# Patient Record
Sex: Female | Born: 1970
Health system: Southern US, Community
[De-identification: ages and names within clinical notes are randomized; demographics above are authoritative.]

## PROBLEM LIST (undated history)

## (undated) DIAGNOSIS — I10 Essential (primary) hypertension: Secondary | ICD-10-CM

## (undated) DIAGNOSIS — S59909A Unspecified injury of unspecified elbow, initial encounter: Secondary | ICD-10-CM

## (undated) DIAGNOSIS — C801 Malignant (primary) neoplasm, unspecified: Secondary | ICD-10-CM

## (undated) DIAGNOSIS — R51 Headache: Secondary | ICD-10-CM

## (undated) DIAGNOSIS — IMO0001 Reserved for inherently not codable concepts without codable children: Secondary | ICD-10-CM

## (undated) DIAGNOSIS — K219 Gastro-esophageal reflux disease without esophagitis: Secondary | ICD-10-CM

## (undated) DIAGNOSIS — Z5189 Encounter for other specified aftercare: Secondary | ICD-10-CM

## (undated) HISTORY — DX: Essential (primary) hypertension: I10

## (undated) HISTORY — PX: OTHER SURGICAL HISTORY: SHX169

---

## 2002-06-07 HISTORY — PX: ADRENALECTOMY: SHX876

## 2002-06-07 HISTORY — PX: BRAIN SURGERY: SHX531

## 2004-06-11 ENCOUNTER — Inpatient Hospital Stay (HOSPITAL_COMMUNITY): Admission: EM | Admit: 2004-06-11 | Discharge: 2004-06-12 | Payer: Self-pay | Admitting: Emergency Medicine

## 2004-06-11 ENCOUNTER — Ambulatory Visit: Payer: Self-pay | Admitting: Oncology

## 2004-06-12 ENCOUNTER — Ambulatory Visit: Admission: RE | Admit: 2004-06-12 | Discharge: 2004-07-31 | Payer: Self-pay | Admitting: Radiation Oncology

## 2004-06-16 ENCOUNTER — Ambulatory Visit: Payer: Self-pay | Admitting: Oncology

## 2004-08-07 ENCOUNTER — Ambulatory Visit: Payer: Self-pay | Admitting: Oncology

## 2004-08-24 ENCOUNTER — Ambulatory Visit (HOSPITAL_COMMUNITY): Admission: RE | Admit: 2004-08-24 | Discharge: 2004-08-24 | Payer: Self-pay | Admitting: Oncology

## 2004-09-02 ENCOUNTER — Ambulatory Visit: Admission: RE | Admit: 2004-09-02 | Discharge: 2004-09-02 | Payer: Self-pay | Admitting: Radiation Oncology

## 2004-09-11 ENCOUNTER — Ambulatory Visit (HOSPITAL_COMMUNITY): Admission: RE | Admit: 2004-09-11 | Discharge: 2004-09-11 | Payer: Self-pay | Admitting: Oncology

## 2004-09-23 ENCOUNTER — Ambulatory Visit: Payer: Self-pay | Admitting: Oncology

## 2004-10-26 ENCOUNTER — Ambulatory Visit (HOSPITAL_COMMUNITY): Admission: RE | Admit: 2004-10-26 | Discharge: 2004-10-26 | Payer: Self-pay | Admitting: Oncology

## 2004-11-19 ENCOUNTER — Ambulatory Visit: Payer: Self-pay | Admitting: Oncology

## 2004-11-28 ENCOUNTER — Emergency Department (HOSPITAL_COMMUNITY): Admission: EM | Admit: 2004-11-28 | Discharge: 2004-11-28 | Payer: Self-pay | Admitting: Emergency Medicine

## 2004-12-04 ENCOUNTER — Encounter: Admission: RE | Admit: 2004-12-04 | Discharge: 2004-12-04 | Payer: Self-pay | Admitting: Family Medicine

## 2005-01-07 ENCOUNTER — Ambulatory Visit: Payer: Self-pay | Admitting: Oncology

## 2005-02-16 ENCOUNTER — Ambulatory Visit: Payer: Self-pay | Admitting: Oncology

## 2005-03-23 ENCOUNTER — Ambulatory Visit (HOSPITAL_COMMUNITY): Admission: RE | Admit: 2005-03-23 | Discharge: 2005-03-23 | Payer: Self-pay | Admitting: Oncology

## 2005-05-07 ENCOUNTER — Ambulatory Visit (HOSPITAL_COMMUNITY): Admission: RE | Admit: 2005-05-07 | Discharge: 2005-05-07 | Payer: Self-pay | Admitting: Oncology

## 2005-05-12 ENCOUNTER — Ambulatory Visit: Payer: Self-pay | Admitting: Oncology

## 2005-05-27 ENCOUNTER — Ambulatory Visit (HOSPITAL_COMMUNITY): Admission: RE | Admit: 2005-05-27 | Discharge: 2005-05-27 | Payer: Self-pay | Admitting: Oncology

## 2005-06-11 ENCOUNTER — Ambulatory Visit (HOSPITAL_COMMUNITY): Admission: RE | Admit: 2005-06-11 | Discharge: 2005-06-11 | Payer: Self-pay | Admitting: Oncology

## 2005-07-21 ENCOUNTER — Ambulatory Visit: Payer: Self-pay | Admitting: Oncology

## 2005-09-16 ENCOUNTER — Ambulatory Visit: Payer: Self-pay | Admitting: Oncology

## 2005-10-26 ENCOUNTER — Ambulatory Visit (HOSPITAL_COMMUNITY): Admission: RE | Admit: 2005-10-26 | Discharge: 2005-10-26 | Payer: Self-pay | Admitting: Obstetrics & Gynecology

## 2005-11-22 ENCOUNTER — Ambulatory Visit: Payer: Self-pay | Admitting: Oncology

## 2005-12-16 ENCOUNTER — Ambulatory Visit (HOSPITAL_COMMUNITY): Admission: RE | Admit: 2005-12-16 | Discharge: 2005-12-16 | Payer: Self-pay | Admitting: Obstetrics & Gynecology

## 2006-02-01 ENCOUNTER — Ambulatory Visit (HOSPITAL_COMMUNITY): Admission: RE | Admit: 2006-02-01 | Discharge: 2006-02-01 | Payer: Self-pay | Admitting: Obstetrics & Gynecology

## 2006-02-15 ENCOUNTER — Ambulatory Visit: Payer: Self-pay | Admitting: Oncology

## 2006-03-24 ENCOUNTER — Encounter (INDEPENDENT_AMBULATORY_CARE_PROVIDER_SITE_OTHER): Payer: Self-pay | Admitting: *Deleted

## 2006-03-24 ENCOUNTER — Inpatient Hospital Stay (HOSPITAL_COMMUNITY): Admission: AD | Admit: 2006-03-24 | Discharge: 2006-03-27 | Payer: Self-pay | Admitting: Obstetrics & Gynecology

## 2006-03-24 HISTORY — PX: TUBAL LIGATION: SHX77

## 2006-03-31 ENCOUNTER — Ambulatory Visit: Payer: Self-pay | Admitting: Oncology

## 2006-05-12 ENCOUNTER — Ambulatory Visit: Payer: Self-pay | Admitting: Oncology

## 2006-06-02 ENCOUNTER — Encounter: Admission: RE | Admit: 2006-06-02 | Discharge: 2006-06-02 | Payer: Self-pay | Admitting: Internal Medicine

## 2006-06-27 ENCOUNTER — Ambulatory Visit: Payer: Self-pay | Admitting: Oncology

## 2006-08-16 ENCOUNTER — Ambulatory Visit: Payer: Self-pay | Admitting: Oncology

## 2006-08-16 LAB — CBC WITH DIFFERENTIAL/PLATELET
Basophils Absolute: 0 10*3/uL (ref 0.0–0.1)
EOS%: 11.4 % — ABNORMAL HIGH (ref 0.0–7.0)
HCT: 30 % — ABNORMAL LOW (ref 34.8–46.6)
MCH: 30.2 pg (ref 26.0–34.0)
MCHC: 34.6 g/dL (ref 32.0–36.0)
MCV: 87.3 fL (ref 81.0–101.0)
MONO#: 0.1 10*3/uL (ref 0.1–0.9)
Platelets: 64 10*3/uL — ABNORMAL LOW (ref 145–400)
RBC: 3.43 10*6/uL — ABNORMAL LOW (ref 3.70–5.32)
lymph#: 0.9 10*3/uL (ref 0.9–3.3)

## 2006-08-25 LAB — CBC WITH DIFFERENTIAL/PLATELET
BASO%: 0.3 % (ref 0.0–2.0)
Basophils Absolute: 0 10*3/uL (ref 0.0–0.1)
EOS%: 4.6 % (ref 0.0–7.0)
HCT: 26.2 % — ABNORMAL LOW (ref 34.8–46.6)
HGB: 9.3 g/dL — ABNORMAL LOW (ref 11.6–15.9)
MCH: 30.6 pg (ref 26.0–34.0)
MCHC: 35.4 g/dL (ref 32.0–36.0)
MCV: 86.5 fL (ref 81.0–101.0)
MONO%: 6.2 % (ref 0.0–13.0)
NEUT%: 21 % — ABNORMAL LOW (ref 39.6–76.8)
lymph#: 0.9 10*3/uL (ref 0.9–3.3)

## 2006-08-29 LAB — CBC WITH DIFFERENTIAL/PLATELET
BASO%: 0.4 % (ref 0.0–2.0)
Basophils Absolute: 0 10*3/uL (ref 0.0–0.1)
EOS%: 3.5 % (ref 0.0–7.0)
MCH: 30.5 pg (ref 26.0–34.0)
MCHC: 35.3 g/dL (ref 32.0–36.0)
MCV: 86.6 fL (ref 81.0–101.0)
MONO%: 8.6 % (ref 0.0–13.0)
RBC: 3.01 10*6/uL — ABNORMAL LOW (ref 3.70–5.32)
RDW: 16.2 % — ABNORMAL HIGH (ref 11.3–14.5)
lymph#: 0.8 10*3/uL — ABNORMAL LOW (ref 0.9–3.3)

## 2006-08-29 LAB — HOLD TUBE, BLOOD BANK

## 2006-09-05 LAB — CBC WITH DIFFERENTIAL/PLATELET
BASO%: 0.2 % (ref 0.0–2.0)
LYMPH%: 63.5 % — ABNORMAL HIGH (ref 14.0–48.0)
MCHC: 35.6 g/dL (ref 32.0–36.0)
MONO#: 0.1 10*3/uL (ref 0.1–0.9)
NEUT#: 0.4 10*3/uL — CL (ref 1.5–6.5)
Platelets: 93 10*3/uL — ABNORMAL LOW (ref 145–400)
RBC: 2.73 10*6/uL — ABNORMAL LOW (ref 3.70–5.32)
RDW: 16.1 % — ABNORMAL HIGH (ref 11.3–14.5)
WBC: 1.6 10*3/uL — ABNORMAL LOW (ref 3.9–10.0)

## 2006-09-08 LAB — CBC WITH DIFFERENTIAL/PLATELET
BASO%: 0.2 % (ref 0.0–2.0)
Eosinophils Absolute: 0 10*3/uL (ref 0.0–0.5)
HCT: 22.2 % — ABNORMAL LOW (ref 34.8–46.6)
MCHC: 35 g/dL (ref 32.0–36.0)
MONO#: 0.2 10*3/uL (ref 0.1–0.9)
NEUT#: 0.4 10*3/uL — CL (ref 1.5–6.5)
NEUT%: 27.9 % — ABNORMAL LOW (ref 39.6–76.8)
RBC: 2.53 10*6/uL — ABNORMAL LOW (ref 3.70–5.32)
WBC: 1.6 10*3/uL — ABNORMAL LOW (ref 3.9–10.0)
lymph#: 0.9 10*3/uL (ref 0.9–3.3)

## 2006-09-08 LAB — HOLD TUBE, BLOOD BANK

## 2006-09-13 LAB — CBC WITH DIFFERENTIAL/PLATELET
BASO%: 0.4 % (ref 0.0–2.0)
Basophils Absolute: 0 10*3/uL (ref 0.0–0.1)
EOS%: 1.8 % (ref 0.0–7.0)
HCT: 21.8 % — ABNORMAL LOW (ref 34.8–46.6)
HGB: 7.8 g/dL — ABNORMAL LOW (ref 11.6–15.9)
LYMPH%: 52.2 % — ABNORMAL HIGH (ref 14.0–48.0)
MCH: 31.4 pg (ref 26.0–34.0)
MCHC: 35.7 g/dL (ref 32.0–36.0)
MCV: 87.9 fL (ref 81.0–101.0)
NEUT%: 34.7 % — ABNORMAL LOW (ref 39.6–76.8)
Platelets: 140 10*3/uL — ABNORMAL LOW (ref 145–400)

## 2006-09-14 ENCOUNTER — Ambulatory Visit (HOSPITAL_COMMUNITY): Admission: RE | Admit: 2006-09-14 | Discharge: 2006-09-14 | Payer: Self-pay | Admitting: Obstetrics & Gynecology

## 2006-09-19 LAB — CBC WITH DIFFERENTIAL/PLATELET
Basophils Absolute: 0 10*3/uL (ref 0.0–0.1)
EOS%: 2.8 % (ref 0.0–7.0)
Eosinophils Absolute: 0.1 10*3/uL (ref 0.0–0.5)
LYMPH%: 47.4 % (ref 14.0–48.0)
MCH: 30.9 pg (ref 26.0–34.0)
MCV: 88.9 fL (ref 81.0–101.0)
MONO%: 10.1 % (ref 0.0–13.0)
NEUT#: 0.8 10*3/uL — ABNORMAL LOW (ref 1.5–6.5)
Platelets: 205 10*3/uL (ref 145–400)
RBC: 2.66 10*6/uL — ABNORMAL LOW (ref 3.70–5.32)
RDW: 19.7 % — ABNORMAL HIGH (ref 11.3–14.5)

## 2006-09-26 LAB — CBC WITH DIFFERENTIAL/PLATELET
BASO%: 0.7 % (ref 0.0–2.0)
Eosinophils Absolute: 0.1 10*3/uL (ref 0.0–0.5)
LYMPH%: 37.8 % (ref 14.0–48.0)
MCHC: 34.4 g/dL (ref 32.0–36.0)
MCV: 92.3 fL (ref 81.0–101.0)
MONO%: 12.6 % (ref 0.0–13.0)
NEUT#: 1 10*3/uL — ABNORMAL LOW (ref 1.5–6.5)
Platelets: 212 10*3/uL (ref 145–400)
RBC: 2.58 10*6/uL — ABNORMAL LOW (ref 3.70–5.32)
RDW: 23.6 % — ABNORMAL HIGH (ref 11.3–14.5)
WBC: 2.2 10*3/uL — ABNORMAL LOW (ref 3.9–10.0)

## 2006-10-03 ENCOUNTER — Ambulatory Visit: Payer: Self-pay | Admitting: Oncology

## 2006-10-03 LAB — CBC WITH DIFFERENTIAL/PLATELET
BASO%: 0.3 % (ref 0.0–2.0)
Eosinophils Absolute: 0.1 10*3/uL (ref 0.0–0.5)
MCHC: 34.5 g/dL (ref 32.0–36.0)
MONO#: 0.3 10*3/uL (ref 0.1–0.9)
NEUT#: 1.7 10*3/uL (ref 1.5–6.5)
RBC: 2.75 10*6/uL — ABNORMAL LOW (ref 3.70–5.32)
RDW: 22.8 % — ABNORMAL HIGH (ref 11.3–14.5)
WBC: 3.1 10*3/uL — ABNORMAL LOW (ref 3.9–10.0)
lymph#: 0.9 10*3/uL (ref 0.9–3.3)

## 2006-12-02 ENCOUNTER — Ambulatory Visit: Payer: Self-pay | Admitting: Oncology

## 2006-12-06 LAB — CBC WITH DIFFERENTIAL/PLATELET
BASO%: 1.3 % (ref 0.0–2.0)
EOS%: 3.2 % (ref 0.0–7.0)
HGB: 9.9 g/dL — ABNORMAL LOW (ref 11.6–15.9)
MCH: 31.2 pg (ref 26.0–34.0)
MCHC: 33.6 g/dL (ref 32.0–36.0)
RBC: 3.18 10*6/uL — ABNORMAL LOW (ref 3.70–5.32)
RDW: 12.4 % (ref 11.3–14.5)
lymph#: 0.7 10*3/uL — ABNORMAL LOW (ref 0.9–3.3)

## 2006-12-25 ENCOUNTER — Emergency Department (HOSPITAL_COMMUNITY): Admission: EM | Admit: 2006-12-25 | Discharge: 2006-12-25 | Payer: Self-pay | Admitting: Family Medicine

## 2007-01-29 ENCOUNTER — Ambulatory Visit: Payer: Self-pay | Admitting: Oncology

## 2007-04-20 ENCOUNTER — Ambulatory Visit: Payer: Self-pay | Admitting: Oncology

## 2007-07-18 ENCOUNTER — Encounter: Admission: RE | Admit: 2007-07-18 | Discharge: 2007-07-18 | Payer: Self-pay | Admitting: Internal Medicine

## 2007-07-27 ENCOUNTER — Ambulatory Visit: Payer: Self-pay | Admitting: Oncology

## 2007-09-05 ENCOUNTER — Ambulatory Visit: Payer: Self-pay | Admitting: Oncology

## 2007-10-26 ENCOUNTER — Ambulatory Visit: Payer: Self-pay | Admitting: Oncology

## 2007-12-01 ENCOUNTER — Ambulatory Visit (HOSPITAL_COMMUNITY): Admission: RE | Admit: 2007-12-01 | Discharge: 2007-12-01 | Payer: Self-pay | Admitting: Oncology

## 2007-12-27 ENCOUNTER — Ambulatory Visit: Payer: Self-pay | Admitting: Oncology

## 2008-02-09 ENCOUNTER — Ambulatory Visit: Payer: Self-pay | Admitting: Oncology

## 2008-04-04 ENCOUNTER — Ambulatory Visit: Payer: Self-pay | Admitting: Oncology

## 2008-05-08 ENCOUNTER — Ambulatory Visit (HOSPITAL_COMMUNITY): Admission: RE | Admit: 2008-05-08 | Discharge: 2008-05-08 | Payer: Self-pay | Admitting: Oncology

## 2008-07-16 ENCOUNTER — Ambulatory Visit: Payer: Self-pay | Admitting: Oncology

## 2008-07-18 ENCOUNTER — Encounter: Admission: RE | Admit: 2008-07-18 | Discharge: 2008-07-18 | Payer: Self-pay | Admitting: Internal Medicine

## 2008-09-03 ENCOUNTER — Emergency Department (HOSPITAL_COMMUNITY): Admission: EM | Admit: 2008-09-03 | Discharge: 2008-09-03 | Payer: Self-pay | Admitting: Emergency Medicine

## 2009-02-18 ENCOUNTER — Ambulatory Visit: Payer: Self-pay | Admitting: Oncology

## 2009-03-25 ENCOUNTER — Ambulatory Visit: Payer: Self-pay | Admitting: Oncology

## 2009-07-24 ENCOUNTER — Encounter: Admission: RE | Admit: 2009-07-24 | Discharge: 2009-07-24 | Payer: Self-pay | Admitting: Internal Medicine

## 2009-08-22 ENCOUNTER — Ambulatory Visit: Payer: Self-pay | Admitting: Oncology

## 2009-10-17 ENCOUNTER — Ambulatory Visit: Payer: Self-pay | Admitting: Oncology

## 2009-12-19 ENCOUNTER — Ambulatory Visit: Payer: Self-pay | Admitting: Oncology

## 2010-03-17 ENCOUNTER — Ambulatory Visit: Payer: Self-pay | Admitting: Oncology

## 2010-04-23 ENCOUNTER — Ambulatory Visit: Payer: Self-pay | Admitting: Oncology

## 2010-05-06 LAB — CBC WITH DIFFERENTIAL/PLATELET
Basophils Absolute: 0 10*3/uL (ref 0.0–0.1)
EOS%: 2.5 % (ref 0.0–7.0)
Eosinophils Absolute: 0.1 10*3/uL (ref 0.0–0.5)
HGB: 11.3 g/dL — ABNORMAL LOW (ref 11.6–15.9)
LYMPH%: 46.1 % (ref 14.0–49.7)
MCHC: 34.1 g/dL (ref 31.5–36.0)
MONO#: 0.4 10*3/uL (ref 0.1–0.9)
MONO%: 8.7 % (ref 0.0–14.0)
NEUT%: 42.1 % (ref 38.4–76.8)
WBC: 4.6 10*3/uL (ref 3.9–10.3)

## 2010-05-12 LAB — COMPREHENSIVE METABOLIC PANEL
ALT: 13 U/L (ref 0–35)
AST: 18 U/L (ref 0–37)
BUN: 24 mg/dL — ABNORMAL HIGH (ref 6–23)
Chloride: 107 mEq/L (ref 96–112)
Creatinine, Ser: 1.2 mg/dL (ref 0.40–1.20)
Potassium: 4.4 mEq/L (ref 3.5–5.3)
Sodium: 138 mEq/L (ref 135–145)
Total Bilirubin: 0.3 mg/dL (ref 0.3–1.2)
Total Protein: 7.1 g/dL (ref 6.0–8.3)

## 2010-05-12 LAB — CHROMOGRANIN A

## 2010-06-17 ENCOUNTER — Ambulatory Visit: Payer: Self-pay | Admitting: Oncology

## 2010-06-28 ENCOUNTER — Encounter: Payer: Self-pay | Admitting: Internal Medicine

## 2010-06-28 ENCOUNTER — Encounter: Payer: Self-pay | Admitting: Oncology

## 2010-07-29 ENCOUNTER — Other Ambulatory Visit: Payer: Self-pay | Admitting: Internal Medicine

## 2010-07-29 DIAGNOSIS — Z1231 Encounter for screening mammogram for malignant neoplasm of breast: Secondary | ICD-10-CM

## 2010-08-12 ENCOUNTER — Ambulatory Visit
Admission: RE | Admit: 2010-08-12 | Discharge: 2010-08-12 | Disposition: A | Discharge status: Home / Self Care | Payer: Medicare Other | Source: Ambulatory Visit | Attending: Internal Medicine | Admitting: Internal Medicine

## 2010-08-12 DIAGNOSIS — Z1231 Encounter for screening mammogram for malignant neoplasm of breast: Secondary | ICD-10-CM

## 2010-08-27 ENCOUNTER — Encounter (HOSPITAL_BASED_OUTPATIENT_CLINIC_OR_DEPARTMENT_OTHER): Payer: Medicare Other | Admitting: Oncology

## 2010-08-27 DIAGNOSIS — C7490 Malignant neoplasm of unspecified part of unspecified adrenal gland: Secondary | ICD-10-CM

## 2010-09-17 LAB — POCT URINALYSIS DIP (DEVICE)
Glucose, UA: NEGATIVE mg/dL
Ketones, ur: NEGATIVE mg/dL
Nitrite: NEGATIVE

## 2010-09-17 LAB — URINE CULTURE: Colony Count: 15000

## 2010-09-28 ENCOUNTER — Other Ambulatory Visit: Payer: Self-pay | Admitting: Obstetrics & Gynecology

## 2010-09-28 DIAGNOSIS — N912 Amenorrhea, unspecified: Secondary | ICD-10-CM

## 2010-09-29 ENCOUNTER — Other Ambulatory Visit (HOSPITAL_COMMUNITY): Payer: Self-pay | Admitting: General Surgery

## 2010-10-01 ENCOUNTER — Ambulatory Visit (HOSPITAL_COMMUNITY)
Admission: RE | Admit: 2010-10-01 | Discharge: 2010-10-01 | Disposition: A | Discharge status: Home / Self Care | Payer: Medicare Other | Source: Ambulatory Visit | Attending: Obstetrics & Gynecology | Admitting: Obstetrics & Gynecology

## 2010-10-01 DIAGNOSIS — N912 Amenorrhea, unspecified: Secondary | ICD-10-CM

## 2010-10-13 ENCOUNTER — Ambulatory Visit (HOSPITAL_COMMUNITY)
Admission: RE | Admit: 2010-10-13 | Discharge: 2010-10-13 | Disposition: A | Discharge status: Home / Self Care | Payer: Medicare Other | Source: Ambulatory Visit | Attending: General Surgery | Admitting: General Surgery

## 2010-10-13 ENCOUNTER — Ambulatory Visit (HOSPITAL_COMMUNITY)
Admission: RE | Admit: 2010-10-13 | Discharge: 2010-10-13 | Disposition: A | Discharge status: Home / Self Care | Payer: Medicare Other | Source: Ambulatory Visit | Attending: Obstetrics & Gynecology | Admitting: Obstetrics & Gynecology

## 2010-10-13 DIAGNOSIS — Z13820 Encounter for screening for osteoporosis: Secondary | ICD-10-CM | POA: Insufficient documentation

## 2010-10-13 DIAGNOSIS — I498 Other specified cardiac arrhythmias: Secondary | ICD-10-CM | POA: Insufficient documentation

## 2010-10-13 DIAGNOSIS — K219 Gastro-esophageal reflux disease without esophagitis: Secondary | ICD-10-CM | POA: Insufficient documentation

## 2010-10-13 DIAGNOSIS — I10 Essential (primary) hypertension: Secondary | ICD-10-CM | POA: Insufficient documentation

## 2010-10-19 ENCOUNTER — Emergency Department (HOSPITAL_COMMUNITY): Payer: No Typology Code available for payment source

## 2010-10-19 ENCOUNTER — Emergency Department (HOSPITAL_COMMUNITY)
Admission: EM | Admit: 2010-10-19 | Discharge: 2010-10-19 | Disposition: A | Discharge status: Home / Self Care | Payer: No Typology Code available for payment source | Attending: Emergency Medicine | Admitting: Emergency Medicine

## 2010-10-19 DIAGNOSIS — I10 Essential (primary) hypertension: Secondary | ICD-10-CM | POA: Insufficient documentation

## 2010-10-19 DIAGNOSIS — Y929 Unspecified place or not applicable: Secondary | ICD-10-CM | POA: Insufficient documentation

## 2010-10-19 DIAGNOSIS — M25569 Pain in unspecified knee: Secondary | ICD-10-CM | POA: Insufficient documentation

## 2010-10-19 DIAGNOSIS — T1490XA Injury, unspecified, initial encounter: Secondary | ICD-10-CM | POA: Insufficient documentation

## 2010-10-19 DIAGNOSIS — W010XXA Fall on same level from slipping, tripping and stumbling without subsequent striking against object, initial encounter: Secondary | ICD-10-CM | POA: Insufficient documentation

## 2010-10-19 DIAGNOSIS — M25579 Pain in unspecified ankle and joints of unspecified foot: Secondary | ICD-10-CM | POA: Insufficient documentation

## 2010-10-22 ENCOUNTER — Other Ambulatory Visit: Payer: Self-pay | Admitting: Oncology

## 2010-10-22 ENCOUNTER — Encounter (HOSPITAL_BASED_OUTPATIENT_CLINIC_OR_DEPARTMENT_OTHER): Payer: Medicare Other | Admitting: Oncology

## 2010-10-22 DIAGNOSIS — C7490 Malignant neoplasm of unspecified part of unspecified adrenal gland: Secondary | ICD-10-CM

## 2010-10-23 NOTE — Op Note (Signed)
[name], [name]                ACCOUNT NO.:  [id number]   MEDICAL RECORD NO.:  [id number]          PATIENT TYPE:  INP   LOCATION:  9122                          FACILITY:  [facility]   PHYSICIAN:  Roseanna Rainbow, M.D.DATE OF BIRTH:  09-Dec-1970   DATE OF PROCEDURE:  03/24/2006  DATE OF DISCHARGE:                                 OPERATIVE REPORT   PREOPERATIVE DIAGNOSIS:  Intrauterine pregnancy at term, desires  sterilization.   POSTOPERATIVE DIAGNOSIS:  Intrauterine pregnancy at term, desires  sterilization.   PROCEDURE:  Primary low uterine flap was cesarean delivery and modified  Pomeroy bilateral tubal ligation via midline incision.   SURGEON:  Roseanna Rainbow, M.D.   ASSISTANT:  Bing Neighbors. Clearance Coots, M.D.   ANESTHESIA:  Epidural.   ESTIMATED BLOOD LOSS:  600 mL.   COMPLICATIONS:  None.   DESCRIPTION OF PROCEDURE:  The patient is taken to the operating room with  an IV running.  An epidural catheter was placed and dosed.  She was placed  in the dorsal supine position and prepped and draped in usual sterile  fashion.  A midline skin incision was made with the scalpel through the  previous scar and carried down to the underlying fascia carried.  The fascia  was incised along the length of the incision.  The parietal peritoneum was  tented up and entered sharply.  The peritoneal incision was then extended  superiorly and inferiorly with good visualization of the bladder.  The  bladder blade was then placed.  The vesicouterine peritoneum was tented up  and entered sharply with Metzenbaum scissors.  This incision was then  extended bilaterally and the bladder flap created bluntly.  The bladder  blade was replaced.  The lower uterine segment was incised in a transverse  fashion with the scalpel.  The uterine incision was then extended bluntly.  The infant's head was then delivered atraumatically.  The cord was clamped  and cut.  The oropharynx was suctioned with bulb  suction.  The infant was  handed off to the awaiting neonatologist.  The placenta was then removed.  The intrauterine cavity was evacuated of any remaining amniotic fluid, clots  and debris with a moist laparotomy sponge.  The uterine incision was then  reapproximated in a running interlocking fashion using 0 Monocryl.  A second  suture of the same was used to place an imbricating layer.   The mid-isthmic portion of the left fallopian tube was then grasped with a  Babcock clamp.  A 2 cm segment of tube was then doubly ligated with 0 plain  and excised.  Adequate hemostasis was noted.  The right fallopian tube was  manipulated in a similar fashion.  The pericolic gutters were then  irrigated.  The fascia and parietal peritoneum were reapproximated as a  single layer using a running suture of #1 PDS.  The skin was reapproximated  in a subcuticular  fashion using 3-0 Monocryl.  At the close of the procedure, the instrument  and pack counts were said to be correct x2.  1 gram of cephazolin was given  at cord clamp.  The patient was taken to the PACU awake and in stable  condition.      Roseanna Rainbow, M.D.  Electronically Signed     LAJ/MEDQ  D:  03/24/2006  T:  03/25/2006  Job:  [id number]

## 2010-10-23 NOTE — Discharge Summary (Signed)
[name], [name]                ACCOUNT NO.:  [id number]   MEDICAL RECORD NO.:  [id number]          PATIENT TYPE:  INP   LOCATION:  9122                          [facility]  [facility]   PHYSICIAN:  Charles A. Clearance Coots, M.D.DATE OF BIRTH:  12/01/1970   DATE OF ADMISSION:  03/24/2006  DATE OF DISCHARGE:  03/27/2006                                 DISCHARGE SUMMARY   ADMITTING DIAGNOSES:  1. Term pregnancy elective.  2. Primary cesarean section.  3. History of metastatic brain tumor with resections in 2004 and 2005.      Recommended patient not be a candidate for spontaneous vaginal delivery      secondary to increased cranial pressure associated with pushing efforts      and possible morbidity.  4. Desires permanent sterilization.   DISCHARGE DIAGNOSES:  1. Term pregnancy elective.  2. Primary cesarean section.  3. History of metastatic brain tumor with resections in 2004 and 2005.      Recommended patient not be a candidate for spontaneous vaginal delivery      secondary to increased cranial pressure associated with pushing efforts      and possible morbidity.  4. Desires permanent sterilization.  5. Status post primary low transverse cesarean section on March 24, 2006.   Delivery of a viable female infant at 11:07.  Apgars of 8 at one minute and  9 at five minutes.  Weight 3170 grams, length 49 cm.  Mother and infant  discharged home in good condition.   REASON FOR ADMISSION:  A 40 year old black female para 2 with estimated date  of confinement was April 02, 2006.  She presents for elective  sterilization.   PAST MEDICAL HISTORY:  Surgery:  Brain tumor resection in 2004 and 2005.  Oral Surgery:  Right adrenal gland resection, heart surgery, laparoscopy.  Illnesses:  Paraganglioma of the adrenal, metastatic.  Medications:  See medication list.   ALLERGIES:  ENALAPRIL causes facial swelling.   SOCIAL HISTORY:  Single living with significant other.  Negative tobacco,  alcohol or recreational drug use.   FAMILY HISTORY:  1. Positive for cancer of the breast and lung.  2. Positive for diabetes.  3. Positive for heart disease.   PHYSICAL EXAMINATION:  GENERAL:  A well-nourished, well-developed female in  no acute distress.  VITAL SIGNS:  Afebrile.  Blood pressure 140/80.  LUNGS:  Clear to auscultation bilaterally.  HEART:  Regular rate and rhythm.  ABDOMEN:  Gravid, nontender.  PELVIC EXAM:  Omitted.   ADMITTING LABORATORY VALUES:  Hemoglobin 10.3, hematocrit 29.8, white blood  cell count 6100, platelets 219,000. Comprehensive metabolic panel was within  normal limits.   HOSPITAL COURSE:  The patient underwent primary elective low-transverse  cesarean section on 03/24/2006.  There were no intraoperative complications.  Postoperative course was uncomplicated.  The patient was discharged home on  postop day #3 in good condition.   DISCHARGE LABORATORY VALUES:  Hemoglobin 9.7, hematocrit 28.4, white blood  cell count 9000, platelets 220,000.   DISCHARGE MEDICATIONS:  1. Continue prenatal vitamins.  2. Tylox and ibuprofen  were prescribed for pain.   DISCHARGE DISPOSITION:  Routine written instructions were given for  discharge after cesarean section.  The patient is to call office for  followup appointment in 2 weeks.      Charles A. Clearance Coots, M.D.  Electronically Signed     CAH/MEDQ  D:  03/27/2006  T:  03/28/2006  Job:  [id number]

## 2010-10-23 NOTE — Consult Note (Signed)
[name], [name]                ACCOUNT NO.:  [id number]   MEDICAL RECORD NO.:  [id number]         PATIENT TYPE:  INP   LOCATION:  0284                         FACILITY:  [facility]   PHYSICIAN:  Leighton Roach. Truett Perna, M.D. DATE OF BIRTH:  [birth year]   DATE OF CONSULTATION:  DATE OF DISCHARGE:                                   CONSULTATION   DATE OF CONSULTATION:  June 11, 2004   REASON FOR CONSULTATION:  Metastatic pheochromocytoma.   REFERRING PHYSICIAN:  Renford Dills, M.D.   HISTORY OF PRESENT ILLNESS:  [name] is a 40 year old woman with history of  pheochromocytoma. She presented to the [facility] Emergency Department  today with transient tongue weakness. MRI of the brain revealed a base of  the skull mass. [name] reports undergoing resection of an adrenal tumor  and pericardial tumor in April 2003. She developed a sella recurrence in  2004 and underwent resection confirming metastatic pheochromocytoma. The  urine metanephrines have been elevated this year, but no tumor has been  found on CT, MIBG scan or brain MRI in May 2005. She recently relocated to  the [place] area. Prior health care was received in [place] and [place].   [name] reports that she feels well at present. The tongue symptoms have  resolved. Oncology consultation was requested for further evaluation of this  patient with metastatic pheochromocytoma.   PAST MEDICAL HISTORY:  1.  Hypertension.  2.  G3 P2, one abortion, regular menses.   HOME MEDICATIONS:  Topamax, labetalol, Norvasc, Clarinex.   ALLERGIES:  ENALAPRIL-swelling.   FAMILY HISTORY:  Mother with history of breast cancer in her 85s. No other  family history of cancer. She has one sister and four brothers. One brother  is deceased secondary to cirrhosis.   SOCIAL HISTORY:  [name] recently relocated to the [place] area from [place]. She currently lives with her boyfriend and two children. She was  previously employed as  a IT consultant. She is disabled. She has no history of  EtOH or tobacco use. She had a blood transfusion with previous surgery. She  denies any risk factors for HIV or hepatitis.   REVIEW OF SYMPTOMS:  [name] denies any fever or sweats. She has occasional  flushing. She denies any anorexia. She has periodic headaches. Speech was  somewhat slurred yesterday due to the tongue weakness. She has also had a  persistent left earache. She denies any visual disturbances. She denies any  numbness or tingling of her extremities. She denies any dysphagia. She has  no shortness of breath or chest pain. She has no bowel or bladder  complaints.   PHYSICAL EXAMINATION:  VITAL SIGNS:  Temperature 98.4, heart rate 81,  respirations 20, blood pressure 148/99, oxygen saturation 100% room air.  Height 66 inches, weight 190 pounds, estimated.  GENERAL:  Pleasant African-American female in no acute distress.  HEENT:  Pupils equal, round, and reactive to light. Extraocular movements  intact. Fullness/tenderness over the left medial sternocleidomastoid muscle.  LYMPH:  No palpable supraclavicular or axillary lymph nodes.  CHEST:  Lungs are clear.  CARDIOVASCULAR:  Tachycardic, regular. A 2/6 systolic murmur.  ABDOMEN:  Soft. No hepatosplenomegaly. Low mid abdominal scar. Right  posterior chest scar.  NEUROLOGIC:  Nonfocal.   IMPRESSION:  1.  History of pheochromocytoma status post resection of adrenal, cardiac,      and sella tumor.  2.  MRI with base of skull mass-likely recurrence of sella pheochromocytoma.  3.  Left neck fullness.  4.  Transient tongue weakness and left ear pain secondary to #1.  5.  Hypertension secondary to #1.   RECOMMENDATIONS:  1.  Obtain records from [place] oncologist to include [facility]      records.  2.  A 24-hour urine metanephrines.  3.  CT skull/neck/chest/abdomen/pelvis.  4.  Blood pressure control.  5.  Radiation oncology consultation.  6.  Genetic workup for  MEN-2, if not done in [place].  7.  Consider radiation/chemotherapy for treatment of metastatic      pheochromocytoma.   Patient interviewed and examined by Dr. Truett Perna; plan reviewed.      LT/MEDQ  D:  06/12/2004  T:  06/12/2004  Job:  [id number]

## 2010-10-23 NOTE — H&P (Signed)
NAMEDAMONIQUE, BRUNELLE                ACCOUNT NO.:  [id number]   MEDICAL RECORD NO.:  [id number]          PATIENT TYPE:  INP   LOCATION:  NA                            [facility]   PHYSICIAN:  Roseanna Rainbow, M.D.DATE OF BIRTH:  January 27, 1971   DATE OF ADMISSION:  DATE OF DISCHARGE:                              HISTORY & PHYSICAL   CHIEF COMPLAINT:  The patient is a 40 year old gravida 5, para 2, with  an estimated date of confinement of April 02, 2006, with an  intrauterine pregnancy at 38+ weeks, with a history of a metastatic  paraganglioma with brain involvement, for an elective cesarean delivery.   HISTORY OF PRESENT ILLNESS:  In October 2003, the patient had a tumor  resected from her adrenal gland and heart.  In April 2004, a brain tumor  was resected, but not completely.  She had another brain tumor found in  January of 2006, for which she underwent chemotherapy and radiation  therapy, ending in September 2006.  She also has chronic hypertension  that is well controlled on labetalol and Norvasc.   PAST OBSTETRICAL/GYNECOLOGICAL HISTORY:  In May of 1991, she was  delivered of a full-term infant, 8 pounds 6 ounces, without  complication.   Dictation ended at this point.      Roseanna Rainbow, M.D.     Judee Clara  D:  03/23/2006  T:  03/24/2006  Job:  [id number]

## 2010-10-26 ENCOUNTER — Encounter: Payer: Medicare Other | Attending: General Surgery | Admitting: *Deleted

## 2010-10-26 DIAGNOSIS — Z713 Dietary counseling and surveillance: Secondary | ICD-10-CM | POA: Insufficient documentation

## 2010-10-26 DIAGNOSIS — Z01818 Encounter for other preprocedural examination: Secondary | ICD-10-CM | POA: Insufficient documentation

## 2010-10-26 LAB — CHROMOGRANIN A: Chromogranin A: 104 ng/mL — ABNORMAL HIGH (ref 1.9–15.0)

## 2010-11-05 ENCOUNTER — Encounter (HOSPITAL_BASED_OUTPATIENT_CLINIC_OR_DEPARTMENT_OTHER): Payer: Medicare Other | Admitting: Oncology

## 2010-11-05 DIAGNOSIS — C7490 Malignant neoplasm of unspecified part of unspecified adrenal gland: Secondary | ICD-10-CM

## 2011-03-04 ENCOUNTER — Encounter (HOSPITAL_BASED_OUTPATIENT_CLINIC_OR_DEPARTMENT_OTHER): Payer: Medicare Other | Admitting: Oncology

## 2011-03-04 DIAGNOSIS — M899 Disorder of bone, unspecified: Secondary | ICD-10-CM

## 2011-03-04 DIAGNOSIS — M542 Cervicalgia: Secondary | ICD-10-CM

## 2011-03-04 DIAGNOSIS — I10 Essential (primary) hypertension: Secondary | ICD-10-CM

## 2011-03-04 DIAGNOSIS — C7490 Malignant neoplasm of unspecified part of unspecified adrenal gland: Secondary | ICD-10-CM

## 2011-03-08 ENCOUNTER — Ambulatory Visit (INDEPENDENT_AMBULATORY_CARE_PROVIDER_SITE_OTHER): Payer: Medicare Other

## 2011-03-08 ENCOUNTER — Inpatient Hospital Stay (INDEPENDENT_AMBULATORY_CARE_PROVIDER_SITE_OTHER)
Admission: RE | Admit: 2011-03-08 | Discharge: 2011-03-08 | Disposition: A | Discharge status: Home / Self Care | Payer: Medicare Other | Source: Ambulatory Visit | Attending: Family Medicine | Admitting: Family Medicine

## 2011-03-08 DIAGNOSIS — S52123A Displaced fracture of head of unspecified radius, initial encounter for closed fracture: Secondary | ICD-10-CM

## 2011-03-12 LAB — CBC
Hemoglobin: 10.2 g/dL — ABNORMAL LOW (ref 12.0–15.0)
RBC: 3.33 MIL/uL — ABNORMAL LOW (ref 3.87–5.11)
WBC: 4.5 10*3/uL (ref 4.0–10.5)

## 2011-05-26 NOTE — Progress Notes (Signed)
  Bariatric Class:  Appt start time: 1700 end time:  1800.  Pre-Operative Nutrition Class  Patient was seen on 05/27/11 for Pre-Operative Nutrition education at [facility].   Surgery date: 06/15/11 Surgery type: LAGB Start wt @ [facility]: 266.7lbs  Weight today: 263.3  Weight change: 3.4 lb lost Total weight lost: 3.4 BMI: 42.5  Samples given per MNT protocol:  Bariatric Advantage Multivitamin  Lot # [id number]  Exp: 9/13   Bariatric Advantage Calcium Citrate  Lot # [id number]  Exp: 12/13   Celebrate VitaminsCalcium Citrate  Lot # [id number]  Exp: 4/13   Unjury Protein - Chicken Soup  Lot # [id number]  Exp: 8/13   The following the learning objective met by the patient during this course:   Identifies Pre-Op Dietary Goals and will begin 2 weeks pre-operatively   Identifies appropriate sources of fluids and proteins   States protein recommendations and appropriate sources pre and post-operatively  Identifies Post-Operative Dietary Goals and will follow for 2 weeks post-operatively  Identifies appropriate multivitamin and calcium sources  Describes the need for physical activity post-operatively and will follow MD recommendations  States when to call healthcare provider regarding medication questions or post-operative complications  Handouts given during class include:  Pre-Op Bariatric Surgery Diet Handout  Protein Shake Handout  Post-Op Bariatric Surgery Nutrition Handout  BELT Program Information Flyer  Support Group Information Flyer  Follow-Up Plan: Patient will follow-up at [facility] 2 weeks post operatively for diet advancement per MD.

## 2011-05-26 NOTE — Patient Instructions (Signed)
Follow:    Pre-Op Diet per MD 2 weeks prior to surgery  Phase 2- Liquids (clear/full) 2 weeks after surgery  Vitamin/Mineral/Calcium guidelines for purchasing bariatric supplements  Exercise guidelines pre and post-op per MD  Follow-up at [facility] in 2 weeks post-op for diet advancement. Contact Juvia Aerts as needed with questions/concerns.  

## 2011-05-27 ENCOUNTER — Encounter (HOSPITAL_COMMUNITY): Payer: Self-pay | Admitting: Pharmacy Technician

## 2011-05-27 ENCOUNTER — Encounter: Payer: Self-pay | Admitting: *Deleted

## 2011-05-27 ENCOUNTER — Encounter: Payer: Medicare Other | Attending: General Surgery | Admitting: *Deleted

## 2011-05-27 DIAGNOSIS — Z713 Dietary counseling and surveillance: Secondary | ICD-10-CM | POA: Insufficient documentation

## 2011-05-27 DIAGNOSIS — Z01818 Encounter for other preprocedural examination: Secondary | ICD-10-CM | POA: Insufficient documentation

## 2011-06-03 ENCOUNTER — Other Ambulatory Visit: Payer: Medicare Other | Admitting: Lab

## 2011-06-04 ENCOUNTER — Telehealth: Payer: Self-pay | Admitting: Oncology

## 2011-06-04 NOTE — Telephone Encounter (Signed)
Talked to pt , gave her appt for end of January 2013 per Dr.Sherrill

## 2011-06-09 ENCOUNTER — Telehealth (INDEPENDENT_AMBULATORY_CARE_PROVIDER_SITE_OTHER): Payer: Self-pay

## 2011-06-09 NOTE — Telephone Encounter (Signed)
[name] [phone]) called requesting orders for [name] to be placed in EPIC.

## 2011-06-10 ENCOUNTER — Other Ambulatory Visit (INDEPENDENT_AMBULATORY_CARE_PROVIDER_SITE_OTHER): Payer: Self-pay | Admitting: General Surgery

## 2011-06-10 ENCOUNTER — Encounter (INDEPENDENT_AMBULATORY_CARE_PROVIDER_SITE_OTHER): Payer: Self-pay | Admitting: General Surgery

## 2011-06-10 ENCOUNTER — Ambulatory Visit (INDEPENDENT_AMBULATORY_CARE_PROVIDER_SITE_OTHER): Payer: Medicare Other | Admitting: General Surgery

## 2011-06-10 DIAGNOSIS — K219 Gastro-esophageal reflux disease without esophagitis: Secondary | ICD-10-CM | POA: Insufficient documentation

## 2011-06-10 DIAGNOSIS — Z86018 Personal history of other benign neoplasm: Secondary | ICD-10-CM | POA: Insufficient documentation

## 2011-06-10 DIAGNOSIS — I10 Essential (primary) hypertension: Secondary | ICD-10-CM

## 2011-06-10 DIAGNOSIS — K2100 Gastro-esophageal reflux disease with esophagitis, without bleeding: Secondary | ICD-10-CM

## 2011-06-10 MED ORDER — PEG 3350-KCL-NABCB-NACL-NASULF 236 G PO SOLR: 4.0000 L | Freq: Once | ORAL | Status: AC

## 2011-06-10 NOTE — Progress Notes (Signed)
Subjective:   Morbid obesity  Patient ID: [name], female   DOB: 26-Mar-1971, 41 y.o.   MRN: [id number]  HPI Patient returns for a preop visit prior to planned laparoscopic Roux-en-Y gastric bypass. She has a many year history of progressive morbid obesity unresponsive to multiple efforts at supervised and unsupervised weight loss. She presents at a BMI of 42.4 at 264 pounds and comorbidities of hypertension and reflux. She has successfully undergone her psychologic and nutrition counseling. Lab work was unremarkable except for mildly elevated creatinine at 1.4. We have previously discussed the procedure and risks in detail she's been through the consent form line by line and reviewed this today. She generally has been feeling okay with no new symptoms.  Past Medical History  Diagnosis Date  . Hypertension    Past Surgical History  Procedure Date  . Adrenalectomy 2004    Pheochromocytoma  . Brain surgery 2006    Recurrent Pheochromocytoma  . Cesarean section    Current Outpatient Prescriptions  Medication Sig Dispense Refill  . Calcium Citrate-Vitamin D (CITRACAL + D PO) Take 1 tablet by mouth daily.        Marland Kitchen gabapentin (NEURONTIN) 100 MG capsule Take 100 mg by mouth 2 (two) times daily.        Marland Kitchen labetalol (NORMODYNE) 200 MG tablet Take 200 mg by mouth 2 (two) times daily.        . norethindrone-ethinyl estradiol (JUNEL FE,GILDESS FE,LOESTRIN FE) 1-20 MG-MCG tablet Take 1 tablet by mouth daily.        Marland Kitchen omeprazole (PRILOSEC) 40 MG capsule Take 40 mg by mouth daily.         Allergies  Allergen Reactions  . Enalapril Swelling and Rash   History  Substance Use Topics  . Smoking status: Never Smoker   . Smokeless tobacco: Never Used  . Alcohol Use: No      Review of Systems  Constitutional: Positive for fatigue.  Respiratory: Negative.   Cardiovascular: Negative.   Gastrointestinal: Negative.        Objective:   Physical Exam Blood pressure 136/82, pulse 70,  temperature 97.8 F (36.6 C), temperature source Temporal, resp. rate 16, height 5\' 6"  (1.676 m), weight 258 lb (117.028 kg). General: Alert, Morbidly obese African American female, in no distress Skin: Warm and dry without rash or infection. HEENT: No palpable masses or thyromegaly. Sclera nonicteric. Pupils equal round and reactive. Oropharynx clear. Lymph nodes: No cervical, supraclavicular, or inguinal nodes palpable. Lungs: Breath sounds clear and equal without increased work of breathing Cardiovascular: Regular rate and rhythm without murmur. No JVD or edema. Peripheral pulses intact. Abdomen: Nondistended. Soft and nontender. No masses palpable. No organomegaly. No palpable hernias.Healed left upper quadrant and low midline incisions Extremities: No edema or joint swelling or deformity. No chronic venous stasis changes. Neurologic: Alert and fully oriented. Gait normal.     Assessment:     Morbid obesity unresponsive to medical management. She is ready for gastric bypass next week. We reviewed the consent form and all her questions were answered. We discussed that in her particular case adhesions could be an issue resulting in open surgery or even inability to complete the operation. She understands and agrees.    Plan:     Laparoscopic Roux-en-Y gastric bypass

## 2011-06-11 ENCOUNTER — Encounter (HOSPITAL_COMMUNITY): Payer: Self-pay

## 2011-06-11 ENCOUNTER — Ambulatory Visit (HOSPITAL_COMMUNITY): Payer: Medicare Other

## 2011-06-11 ENCOUNTER — Ambulatory Visit (HOSPITAL_COMMUNITY)
Admission: RE | Admit: 2011-06-11 | Discharge: 2011-06-11 | Disposition: A | Discharge status: Home / Self Care | Payer: Medicare Other | Source: Ambulatory Visit | Attending: General Surgery | Admitting: General Surgery

## 2011-06-11 ENCOUNTER — Encounter (HOSPITAL_COMMUNITY)
Admission: RE | Admit: 2011-06-11 | Discharge: 2011-06-11 | Disposition: A | Discharge status: Home / Self Care | Payer: Medicare Other | Source: Ambulatory Visit | Attending: General Surgery | Admitting: General Surgery

## 2011-06-11 DIAGNOSIS — Z01812 Encounter for preprocedural laboratory examination: Secondary | ICD-10-CM | POA: Insufficient documentation

## 2011-06-11 DIAGNOSIS — Z01818 Encounter for other preprocedural examination: Secondary | ICD-10-CM | POA: Insufficient documentation

## 2011-06-11 HISTORY — DX: Malignant (primary) neoplasm, unspecified: C80.1

## 2011-06-11 HISTORY — DX: Unspecified injury of unspecified elbow, initial encounter: S59.909A

## 2011-06-11 HISTORY — DX: Headache: R51

## 2011-06-11 HISTORY — DX: Gastro-esophageal reflux disease without esophagitis: K21.9

## 2011-06-11 HISTORY — DX: Encounter for other specified aftercare: Z51.89

## 2011-06-11 HISTORY — DX: Reserved for inherently not codable concepts without codable children: IMO0001

## 2011-06-11 LAB — CBC
HCT: 37.2 % (ref 36.0–46.0)
MCH: 30.7 pg (ref 26.0–34.0)
MCV: 91.4 fL (ref 78.0–100.0)
Platelets: 209 10*3/uL (ref 150–400)
RDW: 13.7 % (ref 11.5–15.5)
WBC: 4.4 10*3/uL (ref 4.0–10.5)

## 2011-06-11 LAB — SURGICAL PCR SCREEN: Staphylococcus aureus: NEGATIVE

## 2011-06-11 LAB — DIFFERENTIAL
Basophils Absolute: 0 10*3/uL (ref 0.0–0.1)
Basophils Relative: 1 % (ref 0–1)
Lymphocytes Relative: 39 % (ref 12–46)
Neutro Abs: 2.1 10*3/uL (ref 1.7–7.7)
Neutrophils Relative %: 48 % (ref 43–77)

## 2011-06-11 LAB — COMPREHENSIVE METABOLIC PANEL
Albumin: 3.9 g/dL (ref 3.5–5.2)
BUN: 24 mg/dL — ABNORMAL HIGH (ref 6–23)
CO2: 23 mEq/L (ref 19–32)
Calcium: 9.8 mg/dL (ref 8.4–10.5)
Chloride: 106 mEq/L (ref 96–112)
Creatinine, Ser: 1.34 mg/dL — ABNORMAL HIGH (ref 0.50–1.10)
GFR calc non Af Amer: 49 mL/min — ABNORMAL LOW (ref >90)
Total Bilirubin: 0.4 mg/dL (ref 0.3–1.2)

## 2011-06-11 LAB — HCG, SERUM, QUALITATIVE: Preg, Serum: NEGATIVE

## 2011-06-11 NOTE — Patient Instructions (Signed)
20 [name]  06/11/2011   Your procedure is scheduled on:  06/15/11 [id number] am   Report to [facility] at 0515 AM.  Call this number if you have problems the morning of surgery: [phone]   Remember: Bowel Prep    Do not eat food:After Midnight.  May have clear liquids:until Midnight .  Clear liquids include soda, tea, black coffee, apple or grape juice, broth.  Take these medicines the morning of surgery with A SIP OF WATER:    Do not wear jewelry, make-up or nail polish.  Do not wear lotions, powders, or perfumes. .  Do not shave 48 hours prior to surgery.  Do not bring valuables to the hospital.  Contacts, dentures or bridgework may not be worn into surgery.  Leave suitcase in the car. After surgery it may be brought to your room.  For patients admitted to the hospital, checkout time is 11:00 AM the day of discharge.      Special Instructions: CHG Shower Use Special Wash: 1/2 bottle night before surgery and 1/2 bottle morning of surgery. shower chin to toes with CHG.  Wash face and private parts with regular soap.     Please read over the following fact sheets that you were given: MRSA Information, Incentive Spirometry Fact Sheet, coughing and deep breathing exercises and leg exercises

## 2011-06-14 ENCOUNTER — Other Ambulatory Visit (INDEPENDENT_AMBULATORY_CARE_PROVIDER_SITE_OTHER): Payer: Self-pay | Admitting: General Surgery

## 2011-06-14 ENCOUNTER — Ambulatory Visit (HOSPITAL_COMMUNITY)
Admission: RE | Admit: 2011-06-14 | Discharge: 2011-06-14 | Disposition: A | Discharge status: Home / Self Care | Payer: Medicare Other | Source: Ambulatory Visit | Attending: General Surgery | Admitting: General Surgery

## 2011-06-15 ENCOUNTER — Inpatient Hospital Stay (HOSPITAL_COMMUNITY): Payer: Medicare Other | Admitting: Certified Registered Nurse Anesthetist

## 2011-06-15 ENCOUNTER — Encounter (HOSPITAL_COMMUNITY): Payer: Self-pay | Admitting: *Deleted

## 2011-06-15 ENCOUNTER — Inpatient Hospital Stay (HOSPITAL_COMMUNITY): Payer: Medicare Other

## 2011-06-15 ENCOUNTER — Encounter (HOSPITAL_COMMUNITY): Payer: Self-pay | Admitting: Certified Registered Nurse Anesthetist

## 2011-06-15 ENCOUNTER — Encounter (HOSPITAL_COMMUNITY)
Admission: RE | Disposition: A | Discharge status: Home / Self Care | Payer: Self-pay | Source: Ambulatory Visit | Attending: General Surgery

## 2011-06-15 ENCOUNTER — Encounter (HOSPITAL_COMMUNITY): Payer: Self-pay | Admitting: Surgery

## 2011-06-15 ENCOUNTER — Inpatient Hospital Stay (HOSPITAL_COMMUNITY)
Admission: RE | Admit: 2011-06-15 | Discharge: 2011-06-17 | DRG: 418 | Disposition: A | Discharge status: Home / Self Care | Payer: Medicare Other | Source: Ambulatory Visit | Attending: General Surgery | Admitting: General Surgery

## 2011-06-15 ENCOUNTER — Other Ambulatory Visit (INDEPENDENT_AMBULATORY_CARE_PROVIDER_SITE_OTHER): Payer: Self-pay | Admitting: General Surgery

## 2011-06-15 DIAGNOSIS — Z6841 Body Mass Index (BMI) 40.0 and over, adult: Secondary | ICD-10-CM

## 2011-06-15 DIAGNOSIS — K812 Acute cholecystitis with chronic cholecystitis: Secondary | ICD-10-CM

## 2011-06-15 DIAGNOSIS — I10 Essential (primary) hypertension: Secondary | ICD-10-CM

## 2011-06-15 DIAGNOSIS — K8010 Calculus of gallbladder with chronic cholecystitis without obstruction: Principal | ICD-10-CM | POA: Diagnosis present

## 2011-06-15 HISTORY — PX: CHOLECYSTECTOMY: SHX55

## 2011-06-15 HISTORY — PX: GASTRIC ROUX-EN-Y: SHX5262

## 2011-06-15 LAB — HEMOGLOBIN AND HEMATOCRIT, BLOOD
HCT: 36 % (ref 36.0–46.0)
Hemoglobin: 11.9 g/dL — ABNORMAL LOW (ref 12.0–15.0)

## 2011-06-15 SURGERY — LAPAROSCOPIC ROUX-EN-Y GASTRIC
Anesthesia: General | Wound class: Clean Contaminated

## 2011-06-15 MED ORDER — HEPARIN SODIUM (PORCINE) 5000 UNIT/ML IJ SOLN
5000.0000 [IU] | Freq: Three times a day (TID) | INTRAMUSCULAR | Status: DC
  Administered 2011-06-15 – 2011-06-17 (×6): 5000 [IU] via SUBCUTANEOUS
  Filled 2011-06-15 (×11): qty 1

## 2011-06-15 MED ORDER — INFLUENZA VIRUS VACC SPLIT PF IM SUSP
0.5000 mL | INTRAMUSCULAR | Status: AC
  Administered 2011-06-16: 0.5 mL via INTRAMUSCULAR
  Filled 2011-06-15: qty 0.5

## 2011-06-15 MED ORDER — KCL IN DEXTROSE-NACL 20-5-0.45 MEQ/L-%-% IV SOLN
INTRAVENOUS | Status: DC
  Administered 2011-06-15: 15:00:00 via INTRAVENOUS
  Administered 2011-06-15 – 2011-06-17 (×4): 125 mL via INTRAVENOUS
  Filled 2011-06-15 (×7): qty 1000

## 2011-06-15 MED ORDER — STERILE WATER FOR IRRIGATION IR SOLN
Status: DC | PRN
  Administered 2011-06-15: 1500 mL

## 2011-06-15 MED ORDER — ACETAMINOPHEN 10 MG/ML IV SOLN
INTRAVENOUS | Status: AC
  Filled 2011-06-15: qty 100

## 2011-06-15 MED ORDER — DEXTROSE 5 % IV SOLN
2.0000 g | INTRAVENOUS | Status: AC
  Administered 2011-06-15: 2 g via INTRAVENOUS

## 2011-06-15 MED ORDER — TISSEEL VH 10 ML EX KIT
PACK | CUTANEOUS | Status: DC | PRN
  Administered 2011-06-15: 10 mL

## 2011-06-15 MED ORDER — PROPOFOL 10 MG/ML IV BOLUS
INTRAVENOUS | Status: DC | PRN
  Administered 2011-06-15: 140 mg via INTRAVENOUS

## 2011-06-15 MED ORDER — NEOSTIGMINE METHYLSULFATE 1 MG/ML IJ SOLN
INTRAMUSCULAR | Status: DC | PRN
  Administered 2011-06-15: 2.5 mg via INTRAVENOUS

## 2011-06-15 MED ORDER — BUPIVACAINE-EPINEPHRINE 0.25% -1:200000 IJ SOLN
INTRAMUSCULAR | Status: AC
  Filled 2011-06-15: qty 1

## 2011-06-15 MED ORDER — ACETAMINOPHEN 160 MG/5ML PO SOLN
650.0000 mg | ORAL | Status: DC | PRN
  Administered 2011-06-17: 650 mg via ORAL
  Filled 2011-06-15: qty 20.3

## 2011-06-15 MED ORDER — FIBRIN SEALANT COMPONENT 5 ML EX KIT
PACK | CUTANEOUS | Status: AC
  Filled 2011-06-15: qty 4

## 2011-06-15 MED ORDER — SUCCINYLCHOLINE CHLORIDE 20 MG/ML IJ SOLN
INTRAMUSCULAR | Status: DC | PRN
  Administered 2011-06-15: 100 mg via INTRAVENOUS

## 2011-06-15 MED ORDER — 0.9 % SODIUM CHLORIDE (POUR BTL) OPTIME
TOPICAL | Status: DC | PRN
  Administered 2011-06-15: 1000 mL

## 2011-06-15 MED ORDER — ONDANSETRON HCL 4 MG/2ML IJ SOLN
INTRAMUSCULAR | Status: DC | PRN
  Administered 2011-06-15: 4 mg via INTRAVENOUS

## 2011-06-15 MED ORDER — ONDANSETRON HCL 4 MG/2ML IJ SOLN
4.0000 mg | INTRAMUSCULAR | Status: DC | PRN
  Administered 2011-06-15 (×2): 4 mg via INTRAVENOUS
  Filled 2011-06-15 (×2): qty 2

## 2011-06-15 MED ORDER — LACTATED RINGERS IR SOLN
Status: DC | PRN
  Administered 2011-06-15: 3000 mL

## 2011-06-15 MED ORDER — GLYCOPYRROLATE 0.2 MG/ML IJ SOLN
INTRAMUSCULAR | Status: DC | PRN
  Administered 2011-06-15: .5 mg via INTRAVENOUS

## 2011-06-15 MED ORDER — ACETAMINOPHEN 10 MG/ML IV SOLN
INTRAVENOUS | Status: DC | PRN
  Administered 2011-06-15: 1000 mg via INTRAVENOUS

## 2011-06-15 MED ORDER — FENTANYL CITRATE 0.05 MG/ML IJ SOLN
INTRAMUSCULAR | Status: DC | PRN
  Administered 2011-06-15: 100 ug via INTRAVENOUS
  Administered 2011-06-15 (×3): 50 ug via INTRAVENOUS

## 2011-06-15 MED ORDER — LACTATED RINGERS IV SOLN
INTRAVENOUS | Status: DC | PRN
  Administered 2011-06-15 (×4): via INTRAVENOUS

## 2011-06-15 MED ORDER — OXYCODONE-ACETAMINOPHEN 5-325 MG/5ML PO SOLN
5.0000 mL | ORAL | Status: DC | PRN
  Administered 2011-06-17 (×3): 5 mL via ORAL
  Filled 2011-06-15 (×2): qty 5
  Filled 2011-06-15: qty 10

## 2011-06-15 MED ORDER — UNJURY CHOCOLATE CLASSIC POWDER
2.0000 [oz_av] | Freq: Four times a day (QID) | ORAL | Status: DC
  Administered 2011-06-17: 2 [oz_av] via ORAL

## 2011-06-15 MED ORDER — UNJURY VANILLA POWDER
2.0000 [oz_av] | Freq: Four times a day (QID) | ORAL | Status: DC
  Administered 2011-06-17: 2 [oz_av] via ORAL

## 2011-06-15 MED ORDER — HYDRALAZINE HCL 20 MG/ML IJ SOLN: 10.0000 mg | INTRAMUSCULAR | Status: DC | PRN

## 2011-06-15 MED ORDER — MIDAZOLAM HCL 5 MG/5ML IJ SOLN
INTRAMUSCULAR | Status: DC | PRN
  Administered 2011-06-15 (×2): 1 mg via INTRAVENOUS

## 2011-06-15 MED ORDER — MORPHINE SULFATE 2 MG/ML IJ SOLN
2.0000 mg | INTRAMUSCULAR | Status: DC | PRN
  Administered 2011-06-15 (×2): 2 mg via INTRAVENOUS
  Administered 2011-06-15 – 2011-06-16 (×4): 4 mg via INTRAVENOUS
  Filled 2011-06-15: qty 1
  Filled 2011-06-15: qty 2
  Filled 2011-06-15: qty 1
  Filled 2011-06-15 (×3): qty 2
  Filled 2011-06-15: qty 1

## 2011-06-15 MED ORDER — UNJURY CHICKEN SOUP POWDER: 2.0000 [oz_av] | Freq: Four times a day (QID) | ORAL | Status: DC

## 2011-06-15 MED ORDER — CEFOXITIN SODIUM-DEXTROSE 1-4 GM-% IV SOLR (PREMIX)
INTRAVENOUS | Status: AC
  Filled 2011-06-15: qty 100

## 2011-06-15 MED ORDER — HYDROMORPHONE HCL PF 1 MG/ML IJ SOLN
INTRAMUSCULAR | Status: AC
  Filled 2011-06-15: qty 1

## 2011-06-15 MED ORDER — HEPARIN SODIUM (PORCINE) 5000 UNIT/ML IJ SOLN: 5000.0000 [IU] | INTRAMUSCULAR | Status: DC

## 2011-06-15 MED ORDER — BUPIVACAINE-EPINEPHRINE 0.25% -1:200000 IJ SOLN
INTRAMUSCULAR | Status: DC | PRN
  Administered 2011-06-15: 50 mL

## 2011-06-15 MED ORDER — IOHEXOL 300 MG/ML  SOLN
INTRAMUSCULAR | Status: AC
  Filled 2011-06-15: qty 1

## 2011-06-15 MED ORDER — HYDRALAZINE HCL 20 MG/ML IJ SOLN
INTRAMUSCULAR | Status: DC | PRN
  Administered 2011-06-15 (×4): 5 mg via INTRAVENOUS

## 2011-06-15 MED ORDER — HYDROMORPHONE HCL PF 1 MG/ML IJ SOLN
INTRAMUSCULAR | Status: DC | PRN
  Administered 2011-06-15: .5 mg via INTRAVENOUS
  Administered 2011-06-15: 1 mg via INTRAVENOUS
  Administered 2011-06-15: .5 mg via INTRAVENOUS

## 2011-06-15 MED ORDER — LIDOCAINE HCL (CARDIAC) 20 MG/ML IV SOLN
INTRAVENOUS | Status: DC | PRN
  Administered 2011-06-15: 80 mg via INTRAVENOUS

## 2011-06-15 MED ORDER — CISATRACURIUM BESYLATE 2 MG/ML IV SOLN
INTRAVENOUS | Status: DC | PRN
  Administered 2011-06-15: 3 mg via INTRAVENOUS
  Administered 2011-06-15 (×2): 4 mg via INTRAVENOUS
  Administered 2011-06-15: 6 mg via INTRAVENOUS
  Administered 2011-06-15: 4 mg via INTRAVENOUS

## 2011-06-15 MED ORDER — METOPROLOL TARTRATE 1 MG/ML IV SOLN
10.0000 mg | Freq: Four times a day (QID) | INTRAVENOUS | Status: DC
  Administered 2011-06-15 – 2011-06-17 (×8): 10 mg via INTRAVENOUS
  Filled 2011-06-15 (×12): qty 10

## 2011-06-15 MED ORDER — HYDROMORPHONE HCL PF 1 MG/ML IJ SOLN
0.2500 mg | INTRAMUSCULAR | Status: DC | PRN
  Administered 2011-06-15 (×2): 0.5 mg via INTRAVENOUS

## 2011-06-15 MED ORDER — HEPARIN SODIUM (PORCINE) 5000 UNIT/ML IJ SOLN
5000.0000 [IU] | Freq: Once | INTRAMUSCULAR | Status: AC
  Administered 2011-06-15: 5000 [IU] via SUBCUTANEOUS

## 2011-06-15 MED ORDER — SODIUM CHLORIDE 0.9 % IJ SOLN
INTRAMUSCULAR | Status: DC | PRN
  Administered 2011-06-15: 11:00:00 via INTRAMUSCULAR

## 2011-06-15 MED ORDER — PROMETHAZINE HCL 25 MG/ML IJ SOLN: 6.2500 mg | INTRAMUSCULAR | Status: DC | PRN

## 2011-06-15 SURGICAL SUPPLY — 105 items
ADH SKN CLS APL DERMABOND .7 (GAUZE/BANDAGES/DRESSINGS) ×1
APL SKNCLS STERI-STRIP NONHPOA (GAUZE/BANDAGES/DRESSINGS)
APL SRG 32X5 SNPLK LF DISP (MISCELLANEOUS) ×1
APPLICATOR COTTON TIP 6IN STRL (MISCELLANEOUS) ×3 IMPLANT
APPLIER CLIP ROT 10 11.4 M/L (STAPLE) ×2
APPLIER CLIP ROT 13.4 12 LRG (CLIP)
APR CLP LRG 13.4X12 ROT 20 MLT (CLIP)
APR CLP MED LRG 11.4X10 (STAPLE) ×1
BAG SPEC RTRVL LRG 6X4 10 (ENDOMECHANICALS)
BENZOIN TINCTURE PRP APPL 2/3 (GAUZE/BANDAGES/DRESSINGS) IMPLANT
BLADE SURG 15 STRL LF DISP TIS (BLADE) IMPLANT
BLADE SURG 15 STRL SS (BLADE) ×2
BLADE SURG SZ11 CARB STEEL (BLADE) ×2 IMPLANT
CABLE HIGH FREQUENCY MONO STRZ (ELECTRODE) ×2 IMPLANT
CANISTER SUCTION 2500CC (MISCELLANEOUS) ×2 IMPLANT
CATH REDDICK CHOLANGI 4FR 50CM (CATHETERS) ×1 IMPLANT
CHLORAPREP W/TINT 26ML (MISCELLANEOUS) ×2 IMPLANT
CLIP APPLIE ROT 10 11.4 M/L (STAPLE) ×1 IMPLANT
CLIP APPLIE ROT 13.4 12 LRG (CLIP) IMPLANT
CLIP SUT LAPRA TY ABSORB (SUTURE) ×4 IMPLANT
CLOTH BEACON ORANGE TIMEOUT ST (SAFETY) ×2 IMPLANT
COVER MAYO STAND STRL (DRAPES) ×2 IMPLANT
COVER SURGICAL LIGHT HANDLE (MISCELLANEOUS) ×2 IMPLANT
CUTTER LINEAR ENDO ART 45 ETS (STAPLE) ×2 IMPLANT
DECANTER SPIKE VIAL GLASS SM (MISCELLANEOUS) ×2 IMPLANT
DERMABOND ADVANCED (GAUZE/BANDAGES/DRESSINGS) ×1
DERMABOND ADVANCED .7 DNX12 (GAUZE/BANDAGES/DRESSINGS) IMPLANT
DEVICE SUTURE ENDOST 10MM (ENDOMECHANICALS) ×2 IMPLANT
DISSECTOR BLUNT TIP ENDO 5MM (MISCELLANEOUS) IMPLANT
DRAIN CHANNEL RND F F (WOUND CARE) ×1 IMPLANT
DRAIN PENROSE 18X1/4 LTX STRL (WOUND CARE) ×2 IMPLANT
DRAPE C-ARM 42X72 X-RAY (DRAPES) ×2 IMPLANT
DRAPE CAMERA CLOSED 9X96 (DRAPES) ×2 IMPLANT
DRAPE LAPAROSCOPIC ABDOMINAL (DRAPES) ×2 IMPLANT
ELECT REM PT RETURN 9FT ADLT (ELECTROSURGICAL) ×2
ELECTRODE REM PT RTRN 9FT ADLT (ELECTROSURGICAL) ×1 IMPLANT
EVACUATOR DRAINAGE 10X20 100CC (DRAIN) IMPLANT
EVACUATOR SILICONE 100CC (DRAIN) ×2
GAUZE SPONGE 4X4 16PLY XRAY LF (GAUZE/BANDAGES/DRESSINGS) ×2 IMPLANT
GLOVE BIOGEL PI IND STRL 7.0 (GLOVE) ×1 IMPLANT
GLOVE BIOGEL PI INDICATOR 7.0 (GLOVE) ×1
GLOVE SS BIOGEL STRL SZ 7.5 (GLOVE) ×1 IMPLANT
GLOVE SUPERSENSE BIOGEL SZ 7.5 (GLOVE) ×1
GOWN STRL NON-REIN LRG LVL3 (GOWN DISPOSABLE) ×3 IMPLANT
GOWN STRL REIN XL XLG (GOWN DISPOSABLE) ×4 IMPLANT
HEMOSTAT SURGICEL 4X8 (HEMOSTASIS) IMPLANT
HOVERMATT SINGLE USE (MISCELLANEOUS) ×2 IMPLANT
IV CATH 14GX2 1/4 (CATHETERS) ×1 IMPLANT
IV SET EXT 30 76VOL 4 MALE LL (IV SETS) IMPLANT
KIT BASIN OR (CUSTOM PROCEDURE TRAY) ×2 IMPLANT
KIT GASTRIC LAVAGE 34FR ADT (SET/KITS/TRAYS/PACK) ×2 IMPLANT
NDL SPNL 22GX3.5 QUINCKE BK (NEEDLE) ×1 IMPLANT
NEEDLE SPNL 22GX3.5 QUINCKE BK (NEEDLE) ×2 IMPLANT
NS IRRIG 1000ML POUR BTL (IV SOLUTION) ×2 IMPLANT
PACK CARDIOVASCULAR III (CUSTOM PROCEDURE TRAY) ×2 IMPLANT
PEN SKIN MARKING BROAD (MISCELLANEOUS) ×2 IMPLANT
POUCH SPECIMEN RETRIEVAL 10MM (ENDOMECHANICALS) IMPLANT
RELOAD 45 VASCULAR/THIN (ENDOMECHANICALS) ×2 IMPLANT
RELOAD BLUE (STAPLE) ×4 IMPLANT
RELOAD ENDO STITCH 2.0 (ENDOMECHANICALS) ×22
RELOAD GOLD (STAPLE) ×2 IMPLANT
RELOAD STAPLE 45 2.5 WHT GRN (ENDOMECHANICALS) ×2 IMPLANT
RELOAD STAPLE 45 3.5 BLU ETS (ENDOMECHANICALS) ×1 IMPLANT
RELOAD STAPLE TA45 3.5 REG BLU (ENDOMECHANICALS) ×4 IMPLANT
RELOAD SUT SNGL STCH ABSRB 2-0 (ENDOMECHANICALS) ×5 IMPLANT
RELOAD SUT SNGL STCH BLK 2-0 (ENDOMECHANICALS) ×3 IMPLANT
RELOAD WHITE ECR60W (STAPLE) ×1 IMPLANT
SCALPEL HARMONIC ACE (MISCELLANEOUS) ×1 IMPLANT
SCISSORS LAP 5X35 DISP (ENDOMECHANICALS) ×2 IMPLANT
SEALANT SURGICAL APPL DUAL CAN (MISCELLANEOUS) ×2 IMPLANT
SET CHOLANGIOGRAPH MIX (MISCELLANEOUS) ×2 IMPLANT
SET IRRIG TUBING LAPAROSCOPIC (IRRIGATION / IRRIGATOR) ×2 IMPLANT
SLEEVE ADV FIXATION 12X100MM (TROCAR) ×4 IMPLANT
SLEEVE ADV FIXATION 5X100MM (TROCAR) ×2 IMPLANT
SLEEVE Z-THREAD 5X100MM (TROCAR) ×2 IMPLANT
SOLUTION ANTI FOG 6CC (MISCELLANEOUS) ×2 IMPLANT
SPONGE GAUZE 4X4 12PLY (GAUZE/BANDAGES/DRESSINGS) ×1 IMPLANT
STAPLER STANDARD HANDLE (STAPLE) ×2 IMPLANT
STAPLER VISISTAT 35W (STAPLE) ×2 IMPLANT
STOPCOCK K 69 2C6206 (IV SETS) IMPLANT
STRIP CLOSURE SKIN 1/2X4 (GAUZE/BANDAGES/DRESSINGS) IMPLANT
SUT ETHILON 2 0 PS N (SUTURE) ×1 IMPLANT
SUT ETHILON 3 0 PS 1 (SUTURE) IMPLANT
SUT MNCRL AB 4-0 PS2 18 (SUTURE) ×2 IMPLANT
SUT RELOAD ENDO STITCH 2 48X1 (ENDOMECHANICALS) ×6
SUT RELOAD ENDO STITCH 2.0 (ENDOMECHANICALS) ×5
SUT VIC AB 2-0 SH 27 (SUTURE) ×2
SUT VIC AB 2-0 SH 27X BRD (SUTURE) ×1 IMPLANT
SUTURE RELOAD END STTCH 2 48X1 (ENDOMECHANICALS) ×6 IMPLANT
SUTURE RELOAD ENDO STITCH 2.0 (ENDOMECHANICALS) ×5 IMPLANT
SYR 20CC LL (SYRINGE) ×2 IMPLANT
SYR 50ML LL SCALE MARK (SYRINGE) ×2 IMPLANT
SYR CONTROL 10ML LL (SYRINGE) ×2 IMPLANT
TOWEL OR 17X26 10 PK STRL BLUE (TOWEL DISPOSABLE) ×3 IMPLANT
TRAY FOLEY CATH 14FRSI W/METER (CATHETERS) ×2 IMPLANT
TRAY LAP CHOLE (CUSTOM PROCEDURE TRAY) ×1 IMPLANT
TROCAR ADV FIXATION 12X100MM (TROCAR) ×3 IMPLANT
TROCAR HASSON GELL 12X100 (TROCAR) ×1 IMPLANT
TROCAR XCEL 12X100 BLDLESS (ENDOMECHANICALS) ×2 IMPLANT
TROCAR Z-THREAD FIOS 11X100 BL (TROCAR) ×2 IMPLANT
TROCAR Z-THREAD FIOS 5X100MM (TROCAR) ×2 IMPLANT
TUBING ENDO SMARTCAP (MISCELLANEOUS) ×2 IMPLANT
TUBING FILTER THERMOFLATOR (ELECTROSURGICAL) ×2 IMPLANT
TUBING INSUFFLATION 10FT LAP (TUBING) ×2 IMPLANT
WATER STERILE IRR 1500ML POUR (IV SOLUTION) ×2 IMPLANT

## 2011-06-15 NOTE — Transfer of Care (Signed)
Immediate Anesthesia Transfer of Care Note  Patient: [name]  Procedure(s) Performed:  LAPAROSCOPIC ROUX-EN-Y GASTRIC - upper endoscopy; LAPAROSCOPIC CHOLECYSTECTOMY WITH INTRAOPERATIVE CHOLANGIOGRAM  Patient Location: PACU  Anesthesia Type: General  Level of Consciousness: awake, alert  and oriented  Airway & Oxygen Therapy: Patient Spontanous Breathing and Patient connected to face mask oxygen  Post-op Assessment: Report given to PACU RN  Post vital signs: Reviewed and stable  Complications: No apparent anesthesia complications

## 2011-06-15 NOTE — Op Note (Signed)
Preop diagnosis: Morbid obesity. cholelithiasis  Postop diagnosis: Morbid obesity. cholelithiasis   Surgical procedure: Laparoscopic Roux-en-Y gastric bypass and laparoscopic cholecystectomy with intraoperative cholangiogram  Surgeon: Sharlet Salina T.Trevia Nop M.D.  Asst.: Lodema Pilot M.D.  Anesthesia: General  Complications:  None  EBL: Minimal  Drains: None  Disposition: PACU in good condition  Description of procedure: Patient is brought to the operating room and general anesthesia induced. She had received preoperative broad-spectrum IV antibiotics and subcutaneous heparin. The abdomen was widely sterilely prepped and draped. Patient timeout was performed and correct patient and procedure confirmed. Access was obtained with a 12 mm Optiview trocar in the left upper quadrant and pneumoperitoneum established without difficulty. Under direct vision 12 mm trocars were placed laterally in the right upper quadrant, right upper quadrant midclavicular line, and to the left and above the umbilicus for the camera port. A 5 mm trocar was placed laterally in the left upper quadrant. The omentum was brought into the upper abdomen and the transverse mesocolon elevated and the ligament of Treitz clearly identified. A 40 cm biliopancreatic limb was then carefully measured from the ligament of Treitz. The small intestine was divided at this point with a single firing of the white load linear stapler. A Penrose drain was sutured to the end of the Roux-en-Y limb for later identification. A 100 cm Roux-en-Y limb was then carefully measured. At this point a side-to-side anastomosis was created between the Roux limb and the end of the biliopancreatic limb. This was accomplished with a single firing of the 45 mm white load linear stapler. The common enterotomy was closed with a running 2-0 Vicryl begun at either end of the enterotomy and tied centrally. The mesenteric defect was then closed with running 2-0 silk. The  omentum was then divided with the harmonic scalpel up towards the transverse colon to allow mobility of the Roux limb toward the gastric pouch. The patient was then placed in steep reversed Trendelenburg. Through a 5 mm subxiphoid site the [facility] retractor was placed and the left lobe of the liver elevated with excellent exposure of the upper stomach and hiatus. The angle of Hiss was then mobilized with the harmonic scalpel. A 4 cm gastric pouch was then carefully measured along the lesser curve of the stomach. Dissection was carried along the lesser curve at this point with the Harmonic scalpel working carefully back toward the lesser sac at right angles to the lesser curve. The free lesser sac was then entered. After being sure all tubes were removed from the stomach an initial firing of the gold load 60 mm linear stapler was fired at right angles across the lesser curve for about 4 cm. The gastric pouch was further mobilized posteriorly and then the pouch was completed with 2 further firings of the 60 mm blue load linear stapler up through the previously dissected angle of His. It was ensured that the pouch was completely mobilized away from the gastric remnant. This created a nice tubular 4-5 cm gastric pouch. The staple line of the gastric remnant was then oversewn with 2-0 silk for hemostasis. The Roux limb was then brought up in an antecolic fashion with the candycane facing to the patient's left without undue tension. The gastrojejunostomy was created with an initial posterior row of 2-0 Vicryl between the Roux limb and the staple line of the gastric pouch. Enterotomies were then made in the gastric pouch and the Roux limb with the harmonic scalpel and at approximately 2-2-1/2 cm anastomosis was created with  a single firing of the blue load linear stapler. The staple line was inspected and was intact without bleeding. The common enterotomy was then closed with running 2-0 Vicryl begun at either end and  tied centrally. The wall tube was then easily passed through the anastomosis and an outer anterior layer of running 2-0 Vicryl was placed. The Ewald tube was removed. With the outlet of the gastrojejunostomy clamped and under saline irrigation the assistant performed upper endoscopy and with the gastric pouch tensely distended with air there was no evidence of leak. The pouch was desufflated. The Vonita Moss defect was closed with running 2-0 silk. The abdomen was inspected for any evidence of bleeding or bowel injury and everything looked fine. The Nathanson retractor was removed under direct vision after coating the anastomosis with Tisseel tissue sealant. All CO2 was evacuated and trochars removed. Skin incisions were closed with staples. Sponge needle and instrument counts were correct. The patient was taken to the PACU in good condition.  Attention was then turned to the gallbladder. The gallbladder was found to be markedly thickened and fibrotic and packed with stones. There were some omental adhesions that were taken down with the harmonic scalpel. The gallbladder was impossible to grasp with standard graspers but large tooth graspers were used to elevate the gallbladder and expose and then retract the infundibulum. There was fortunately less inflammation down around Calot's triangle and the porta hepatis. Thickened peritoneum along the distal gallbladder anterior and posterior was incised and the distal gallbladder mobilized. The fibrofatty tissue was carefully stripped down off the neck of the gallbladder toward the porta hepatus. There was still marked fibrosis right at the infundibulum. Staying on the gallbladder wall while mobilizing the infundibulum with the harmonic scalpel we came across a bile duct just adjacent to the gallbladder wall. The anatomy had not yet been defined. The lumen of the duct was easily cannulated with a Reddick Cholangiocath and an operative cholangiogram obtained. This showed the  catheter in a long cystic duct which communicated with a moderately dilated common bile duct and intrahepatic ducts with free flow into the duodenum. Having identified this as the cystic duct with careful blunt dissection it was mobilized and freed up down toward the common bile duct for about a centimeter and then triply clipped and divided. The cystic artery was identified posterior to this upon the gallbladder wall it was divided with the harmonic scalpel. The gallbladder was then dissected free from the liver bed with the harmonic scalpel and hook cautery. The dissection was very tedious with an essentially obliterated the fibrotic posterior gallbladder wall. Multiple stones were evacuated from the gallbladder and initially placed in an Endo Catch bag. The gallbladder was detached and placed in the Endo Catch bag and removed. Multiple stones were then removed with stone forceps the right upper quadrant thoroughly irrigated with a couple of liters of saline and all visible stones removed. There was no bleeding from the gallbladder bed. A close suction drain was left in Morrison's pouch and brought out through the right lateral trocar site. The abdomen was then inspected for hemostasis and everything looked fine. The left upper quadrant incision had been used to remove the gallbladder and had to be enlarged slightly in the skin and fascia and the fascia was closed with 3 interrupted 0 Novafil sutures. All trochars were removed the CO2 evacuated. Skin was closed with subcuticular Monocryl and Steri-Strips. Patient was taken to PACU in good condition.   Mariella Saa MD, FACS  06/15/2011, 11:46 AM

## 2011-06-15 NOTE — Anesthesia Procedure Notes (Addendum)
Procedure Name: Intubation Date/Time: 06/15/2011 7:29 AM Performed by: Hulan Fess Pre-anesthesia Checklist: Patient identified, Emergency Drugs available, Suction available and Timeout performed Patient Re-evaluated:Patient Re-evaluated prior to inductionOxygen Delivery Method: Circle System Utilized Preoxygenation: Pre-oxygenation with 100% oxygen Intubation Type: IV induction Ventilation: Mask ventilation without difficulty Laryngoscope Size: Mac and 3 Grade View: Grade III Tube type: Oral Tube size: 8.0 mm Number of attempts: 1 Placement Confirmation: ETT inserted through vocal cords under direct vision and breath sounds checked- equal and bilateral Secured at: 22 cm Tube secured with: Tape Dental Injury: Teeth and Oropharynx as per pre-operative assessment    Date/Time: 06/15/2011 7:17 AM Performed by: Luisenrique Conran K.

## 2011-06-15 NOTE — Anesthesia Postprocedure Evaluation (Signed)
  Anesthesia Post-op Note  Patient: [name]  Procedure(s) Performed:  LAPAROSCOPIC ROUX-EN-Y GASTRIC - upper endoscopy; LAPAROSCOPIC CHOLECYSTECTOMY WITH INTRAOPERATIVE CHOLANGIOGRAM  Patient Location: PACU  Anesthesia Type: General  Level of Consciousness: awake and alert   Airway and Oxygen Therapy: Patient Spontanous Breathing  Post-op Pain: mild  Post-op Assessment: Post-op Vital signs reviewed, Patient's Cardiovascular Status Stable, Respiratory Function Stable, Patent Airway and No signs of Nausea or vomiting  Post-op Vital Signs: stable  Complications: No apparent anesthesia complications

## 2011-06-15 NOTE — Anesthesia Preprocedure Evaluation (Signed)
Anesthesia Evaluation  Patient identified by MRN, date of birth, ID band Patient awake    Reviewed: Allergy & Precautions, H&P , NPO status , Patient's Chart, lab work & pertinent test results  Airway Mallampati: II TM Distance: >3 FB Neck ROM: Full    Dental No notable dental hx.    Pulmonary neg pulmonary ROS,  clear to auscultation  Pulmonary exam normal       Cardiovascular hypertension, neg cardio ROS Regular Normal    Neuro/Psych Negative Neurological ROS  Negative Psych ROS   GI/Hepatic negative GI ROS, Neg liver ROS, GERD-  ,  Endo/Other  Negative Endocrine ROSMorbid obesity  Renal/GU negative Renal ROS  Genitourinary negative   Musculoskeletal negative musculoskeletal ROS (+)   Abdominal   Peds negative pediatric ROS (+)  Hematology negative hematology ROS (+)   Anesthesia Other Findings   Reproductive/Obstetrics negative OB ROS                           Anesthesia Physical Anesthesia Plan  ASA: III  Anesthesia Plan: General   Post-op Pain Management:    Induction: Intravenous  Airway Management Planned: Oral ETT  Additional Equipment:   Intra-op Plan:   Post-operative Plan: Extubation in OR  Informed Consent: I have reviewed the patients History and Physical, chart, labs and discussed the procedure including the risks, benefits and alternatives for the proposed anesthesia with the patient or authorized representative who has indicated his/her understanding and acceptance.   Dental advisory given  Plan Discussed with: CRNA  Anesthesia Plan Comments:         Anesthesia Quick Evaluation

## 2011-06-15 NOTE — Interval H&P Note (Signed)
History and Physical Interval Note:  06/15/2011 7:02 AM  [name]  has presented today for surgery, with the diagnosis of morbid obesity   The various methods of treatment have been discussed with the patient and family. After consideration of risks, benefits and other options for treatment, the patient has consented to  Procedure(s): LAPAROSCOPIC ROUX-EN-Y GASTRIC LAPAROSCOPIC CHOLECYSTECTOMY WITH INTRAOPERATIVE CHOLANGIOGRAM as a surgical intervention .  The patients' history has been reviewed, patient examined, no change in status, stable for surgery.  I have reviewed the patients' chart and labs.  Questions were answered to the patient's satisfaction.  Discussed presence of gallstones on [place], and that we may remove GB during her surgery if we felt it was safe.   [name] T

## 2011-06-15 NOTE — H&P (View-Only) (Signed)
Subjective:   Morbid obesity  Patient ID: [name], female   DOB: 10/23/1970, 41 y.o.   MRN: [id number]  HPI Patient returns for a preop visit prior to planned laparoscopic Roux-en-Y gastric bypass. She has a many year history of progressive morbid obesity unresponsive to multiple efforts at supervised and unsupervised weight loss. She presents at a BMI of 42.4 at 264 pounds and comorbidities of hypertension and reflux. She has successfully undergone her psychologic and nutrition counseling. Lab work was unremarkable except for mildly elevated creatinine at 1.4. We have previously discussed the procedure and risks in detail she's been through the consent form line by line and reviewed this today. She generally has been feeling okay with no new symptoms.  Past Medical History  Diagnosis Date  . Hypertension    Past Surgical History  Procedure Date  . Adrenalectomy 2004    Pheochromocytoma  . Brain surgery 2006    Recurrent Pheochromocytoma  . Cesarean section    Current Outpatient Prescriptions  Medication Sig Dispense Refill  . Calcium Citrate-Vitamin D (CITRACAL + D PO) Take 1 tablet by mouth daily.        . gabapentin (NEURONTIN) 100 MG capsule Take 100 mg by mouth 2 (two) times daily.        . labetalol (NORMODYNE) 200 MG tablet Take 200 mg by mouth 2 (two) times daily.        . norethindrone-ethinyl estradiol (JUNEL FE,GILDESS FE,LOESTRIN FE) 1-20 MG-MCG tablet Take 1 tablet by mouth daily.        . omeprazole (PRILOSEC) 40 MG capsule Take 40 mg by mouth daily.         Allergies  Allergen Reactions  . Enalapril Swelling and Rash   History  Substance Use Topics  . Smoking status: Never Smoker   . Smokeless tobacco: Never Used  . Alcohol Use: No      Review of Systems  Constitutional: Positive for fatigue.  Respiratory: Negative.   Cardiovascular: Negative.   Gastrointestinal: Negative.        Objective:   Physical Exam Blood pressure 136/82, pulse 70,  temperature 97.8 F (36.6 C), temperature source Temporal, resp. rate 16, height 5' 6" (1.676 m), weight 258 lb (117.028 kg). General: Alert, Morbidly obese African American female, in no distress Skin: Warm and dry without rash or infection. HEENT: No palpable masses or thyromegaly. Sclera nonicteric. Pupils equal round and reactive. Oropharynx clear. Lymph nodes: No cervical, supraclavicular, or inguinal nodes palpable. Lungs: Breath sounds clear and equal without increased work of breathing Cardiovascular: Regular rate and rhythm without murmur. No JVD or edema. Peripheral pulses intact. Abdomen: Nondistended. Soft and nontender. No masses palpable. No organomegaly. No palpable hernias.Healed left upper quadrant and low midline incisions Extremities: No edema or joint swelling or deformity. No chronic venous stasis changes. Neurologic: Alert and fully oriented. Gait normal.     Assessment:     Morbid obesity unresponsive to medical management. She is ready for gastric bypass next week. We reviewed the consent form and all her questions were answered. We discussed that in her particular case adhesions could be an issue resulting in open surgery or even inability to complete the operation. She understands and agrees.    Plan:     Laparoscopic Roux-en-Y gastric bypass      

## 2011-06-15 NOTE — Progress Notes (Signed)
Bowel prep done by pt 06/14/11 with good results

## 2011-06-16 ENCOUNTER — Inpatient Hospital Stay (HOSPITAL_COMMUNITY): Payer: Medicare Other

## 2011-06-16 ENCOUNTER — Encounter (HOSPITAL_COMMUNITY): Payer: Self-pay | Admitting: General Surgery

## 2011-06-16 DIAGNOSIS — Z09 Encounter for follow-up examination after completed treatment for conditions other than malignant neoplasm: Secondary | ICD-10-CM

## 2011-06-16 LAB — CBC
Platelets: 181 10*3/uL (ref 150–400)
RBC: 4.1 MIL/uL (ref 3.87–5.11)
RDW: 13.8 % (ref 11.5–15.5)
WBC: 8.7 10*3/uL (ref 4.0–10.5)

## 2011-06-16 LAB — HEMOGLOBIN AND HEMATOCRIT, BLOOD
HCT: 36.1 % (ref 36.0–46.0)
Hemoglobin: 11.5 g/dL — ABNORMAL LOW (ref 12.0–15.0)

## 2011-06-16 LAB — DIFFERENTIAL
Basophils Absolute: 0 10*3/uL (ref 0.0–0.1)
Eosinophils Relative: 0 % (ref 0–5)
Lymphocytes Relative: 17 % (ref 12–46)
Lymphs Abs: 1.5 10*3/uL (ref 0.7–4.0)
Neutro Abs: 6.7 10*3/uL (ref 1.7–7.7)
Neutrophils Relative %: 77 % (ref 43–77)

## 2011-06-16 MED ORDER — IOHEXOL 300 MG/ML  SOLN
50.0000 mL | Freq: Once | INTRAMUSCULAR | Status: AC | PRN
  Administered 2011-06-16: 50 mL via ORAL

## 2011-06-16 NOTE — Progress Notes (Signed)
Patient ID: [name], female   DOB: 1970-10-21, 41 y.o.   MRN: [id number] 1 Day Post-Op  Subjective: No C/O, mild pain controlled with meds. No nausea  Objective: Vital signs in last 24 hours: Temp:  [97.4 F (36.3 C)-99.3 F (37.4 C)] 99.3 F (37.4 C) (01/09 0620) Pulse Rate:  [60-73] 73  (01/09 0620) Resp:  [12-18] 18  (01/09 0620) BP: (146-182)/(86-112) 150/100 mmHg (01/09 0620) SpO2:  [96 %-100 %] 97 % (01/09 0620) Weight:  [265 lb 3.4 oz (120.3 kg)] 265 lb 3.4 oz (120.3 kg) (01/08 1319) Last BM Date: 06/14/11  Intake/Output from previous day: 01/08 0701 - 01/09 0700 In: 3500 [I.V.:3500] Out: 1780 [Urine:1700; Drains:50; Blood:30] Intake/Output this shift:    General appearance: alert and no distress GI: normal findings: soft, non-tender Incision/Wound:Dressings dry, JP drainage sero-sanguinous  Lab Results:   Basename 06/15/11 1239  WBC --  HGB 11.9*  HCT 36.0  PLT --   BMET No results found for this basename: NA:2,K:2,CL:2,CO2:2,GLUCOSE:2,BUN:2,CREATININE:2,CALCIUM:2 in the last 72 hours PT/INR No results found for this basename: LABPROT:2,INR:2 in the last 72 hours ABG No results found for this basename: PHART:2,PCO2:2,PO2:2,HCO3:2 in the last 72 hours  Studies/Results: Dg Cholangiogram Operative  06/15/2011  *RADIOLOGY REPORT*  Clinical Data:   Gallstones, right upper quadrant pain.  INTRAOPERATIVE CHOLANGIOGRAM  Comparison: Ultrasound 06/14/2011  Findings: The mildly prominent common bile duct and central intrahepatic ducts.  On the final cine images, small filling defects are seen within the central right hepatic duct.  It is unclear if this represents gas bubbles or small stones.  No evidence of obstruction as contrast passes into the small bowel.  IMPRESSION: Mildly prominent biliary system.  Small filling defects within the right hepatic duct on final images.  These could represent gas bubbles or retained stones.  These images were submitted for radiologic  interpretation only. Please see the procedural report for the amount of contrast and the fluoroscopy time utilized.  Original Report Authenticated By: Cyndie Chime, M.D.   US Abdomen Complete  06/14/2011  *RADIOLOGY REPORT*  Clinical Data:  Morbid obesity.  Pre-op evaluation for bariatric surgery.  ABDOMINAL ULTRASOUND COMPLETE  Comparison:  None  Findings: Technically difficult exam due to large patient habitus.  Gallbladder:  A wall-echo-shadow complex is noted in the gallbladder fossa, consistent with gallstones filling the gallbladder. Gallbladder wallis difficult to visualize, but no pericholecystic fluid seen.  Common Bile Duct:  Within normal limits in caliber. Measures 4 mm in diameter.  Liver: No focal mass lesion identified.  Within normal limits in parenchymal echogenicity.  IVC:  Not well visualized due to habitus.  Pancreas:  No abnormality identified.  Spleen:  Not well visualized due to habitus.  Spleen appears small in size,but no definite abnormality identified.  Right kidney:  Normal in size and parenchymal echogenicity.  No evidence of mass or hydronephrosis.  Left kidney:  Normal in size and parenchymal echogenicity.  No evidence of mass or hydronephrosis.  Abdominal Aorta:  No aneurysm identified.  IMPRESSION:  1. Gallstones nearly filling the gallbladder. 2.  No evidence of biliary dilatation or other significant abnormality.  Original Report Authenticated By: Danae Orleans, M.D.    Anti-infectives: Anti-infectives     Start     Dose/Rate Route Frequency Ordered Stop   06/15/11 0515   cefOXitin (MEFOXIN) 2 g in dextrose 5 % 50 mL IVPB        2 g 100 mL/hr over 30 Minutes Intravenous 60 min pre-op  06/15/11 0511 06/15/11 0714          Assessment/Plan: s/p Procedure(s): LAPAROSCOPIC ROUX-EN-Y GASTRIC LAPAROSCOPIC CHOLECYSTECTOMY WITH INTRAOPERATIVE CHOLANGIOGRAM Doing well post op.  For gastrograffin swallow this AM HTN-on IV lopressor and hydralazine-will restart oral meds  p xray today   LOS: 1 day    Ravin Bendall T 06/16/2011

## 2011-06-16 NOTE — Progress Notes (Signed)
*  PRELIMINARY RESULTS*  [name] has been performed. Bilateral:  No evidence of DVT, superficial thrombosis, or Baker's Cyst.    [name] 06/16/2011, 8:37 AM

## 2011-06-17 LAB — DIFFERENTIAL
Basophils Relative: 0 % (ref 0–1)
Eosinophils Absolute: 0.1 10*3/uL (ref 0.0–0.7)
Lymphs Abs: 1.9 10*3/uL (ref 0.7–4.0)
Monocytes Relative: 7 % (ref 3–12)
Neutro Abs: 6.7 10*3/uL (ref 1.7–7.7)
Neutrophils Relative %: 71 % (ref 43–77)

## 2011-06-17 LAB — CBC
Hemoglobin: 11.6 g/dL — ABNORMAL LOW (ref 12.0–15.0)
MCH: 30.4 pg (ref 26.0–34.0)
Platelets: 168 10*3/uL (ref 150–400)
RBC: 3.82 MIL/uL — ABNORMAL LOW (ref 3.87–5.11)

## 2011-06-17 MED ORDER — OXYCODONE-ACETAMINOPHEN 5-325 MG/5ML PO SOLN: 5.0000 mL | ORAL | Status: AC | PRN

## 2011-06-17 MED ORDER — LABETALOL HCL 200 MG PO TABS
200.0000 mg | ORAL_TABLET | Freq: Two times a day (BID) | ORAL | Status: DC
  Administered 2011-06-17: 200 mg via ORAL
  Filled 2011-06-17 (×4): qty 1

## 2011-06-17 NOTE — Progress Notes (Signed)
Patient ID: [name], female   DOB: 03-26-1971, 41 y.o.   MRN: [id number] 2 Days Post-Op  Subjective: No complaints this morning. Tolerating water and ice without problems.  Objective: Vital signs in last 24 hours: Temp:  [99.1 F (37.3 C)-99.4 F (37.4 C)] 99.2 F (37.3 C) (01/10 0510) Pulse Rate:  [70-90] 70  (01/10 0510) Resp:  [18] 18  (01/10 0510) BP: (136-159)/(88-102) 158/90 mmHg (01/10 0510) SpO2:  [95 %-98 %] 97 % (01/10 0510) Last BM Date: 06/14/11  Intake/Output from previous day: 01/09 0701 - 01/10 0700 In: 0  Out: 2870 [Urine:2850; Drains:20] Intake/Output this shift:    General appearance: alert and no distress GI: normal findings: soft, non-tender Incision/Wound:dressings clean and dry. Minimal JP drainage, serous  Lab Results:   Basename 06/17/11 0540 06/16/11 1754 06/16/11 0800  WBC 9.3 -- 8.7  HGB 11.6* 11.5* --  HCT 34.8* 36.1 --  PLT 168 -- 181   BMET No results found for this basename: NA:2,K:2,CL:2,CO2:2,GLUCOSE:2,BUN:2,CREATININE:2,CALCIUM:2 in the last 72 hours PT/INR No results found for this basename: LABPROT:2,INR:2 in the last 72 hours ABG No results found for this basename: PHART:2,PCO2:2,PO2:2,HCO3:2 in the last 72 hours  Studies/Results: Dg Cholangiogram Operative  06/15/2011  *RADIOLOGY REPORT*  Clinical Data:   Gallstones, right upper quadrant pain.  INTRAOPERATIVE CHOLANGIOGRAM  Comparison: Ultrasound 06/14/2011  Findings: The mildly prominent common bile duct and central intrahepatic ducts.  On the final cine images, small filling defects are seen within the central right hepatic duct.  It is unclear if this represents gas bubbles or small stones.  No evidence of obstruction as contrast passes into the small bowel.  IMPRESSION: Mildly prominent biliary system.  Small filling defects within the right hepatic duct on final images.  These could represent gas bubbles or retained stones.  These images were submitted for radiologic  interpretation only. Please see the procedural report for the amount of contrast and the fluoroscopy time utilized.  Original Report Authenticated By: Cyndie Chime, M.D.   Dg Kayleen Memos W/water Sol Cm  06/16/2011  *RADIOLOGY REPORT*  Clinical Data: Four status post gastric bypass and cholecystectomy 06/15/2011.  ESOPHAGUS WITH WATER SOLUTION CONTRAST  Comparison: None.  Findings: 50 ml Omnipaque-300 were administered by mouth.  1.2 minutes of fluoroscopy time was used.  The patient's gastric remnant is unremarkable in appearance.  Both the efferent and afferent loops appears somewhat dilated. No leakage of contrast material about the gastric remnant is seen.  During observation in radiology for approximately 40 minutes, contrast material would not pass beyond the proximal most portion the efferent loop. Subsequent imaging demonstrates contrast in distal small bowel.  No evidence of leak is identified.  IMPRESSION: Status post gastric bypass without evidence of complication.  Original Report Authenticated By: Bernadene Elvin. Maricela Curet, M.D.    Anti-infectives: Anti-infectives     Start     Dose/Rate Route Frequency Ordered Stop   06/15/11 0515   cefOXitin (MEFOXIN) 2 g in dextrose 5 % 50 mL IVPB        2 g 100 mL/hr over 30 Minutes Intravenous 60 min pre-op 06/15/11 0511 06/15/11 0714          Assessment/Plan: s/p Procedure(s): LAPAROSCOPIC ROUX-EN-Y GASTRIC LAPAROSCOPIC CHOLECYSTECTOMY WITH INTRAOPERATIVE CHOLANGIOGRAM Doing very well following Roux-en-Y gastric bypass and cholecystectomy. No complications identified. Will begin protein shakes. If this is tolerated well she could go home later today.   LOS: 2 days    [name] T 06/17/2011

## 2011-06-17 NOTE — Plan of Care (Signed)
Problem: Consults Goal: Gastric Bypass Patient Education See Patient Education Module for education specifics.  Outcome: Completed/Met Date Met:  06/17/11 Bariatric discharge teaching performed at discharge.  Pts questions answered, verbalized understanding.

## 2011-06-17 NOTE — Discharge Summary (Signed)
   Patient ID: [name] [id number] y.o. 12-20-1970  06/15/2011  Discharge date and time: @T @  Admitting Physician: Glenna Fellows T  Discharge Physician: Glenna Fellows T  Admission Diagnoses: morbid obesity and gallstones  Discharge Diagnoses:same  Operations: Procedure(s): LAPAROSCOPIC ROUX-EN-Y GASTRIC LAPAROSCOPIC CHOLECYSTECTOMY WITH INTRAOPERATIVE CHOLANGIOGRAM  Admission Condition: good  Discharged Condition: good  Indication for Admission: 41 year old female with progressive morbid obesity unresponsive to medical management admitted for elective gastric bypass. She has gallstones discovered on preop workup and will also undergo cholecystectomy  Hospital Course: patient was admitted on the morning of her procedure and underwent an uneventful laparoscopic Roux-en-Y gastric bypass. She also underwent cholecystectomy with cholangiogram for a severely chronically inflamed gallbladder with multiple stones. Her postoperative course was very smooth. She had some intermittent hypertension controlled with IV beta blockers and hydralazine. Her pain was well-controlled. Her hemoglobin remained stable. She had an unremarkable Gastrografin swallow on the first postoperative day. She was begun on water which he tolerated well. By the second postoperative day she was very comfortable with normal vital signs. Abdomen is soft and nontender. She's starting protein shakes. Pain is well controlled with oral medications. She has minimal serosanguineous JP drainage from her gallbladder drain which is removed. He is ready for discharge at this time.  Consults: none  Significant Diagnostic Studies: Gastrografin swallow on postoperative day 1 without leak or obstruction   Disposition: Home  Patient Instructions:   [name], [name]  Home Medication Instructions [id number]   Printed on:06/17/11 0856  Medication Information                    labetalol (NORMODYNE) 200 MG  tablet Take 200 mg by mouth 2 (two) times daily.            omeprazole (PRILOSEC) 40 MG capsule Take 40 mg by mouth daily.            norethindrone-ethinyl estradiol (JUNEL FE,GILDESS FE,LOESTRIN FE) 1-20 MG-MCG tablet Take 1 tablet by mouth daily.            gabapentin (NEURONTIN) 100 MG capsule Take 100 mg by mouth 2 (two) times daily.            Calcium Citrate-Vitamin D (CITRACAL + D PO) Take 1 tablet by mouth daily.            oxyCODONE-acetaminophen (ROXICET) 5-325 MG/5ML solution Take 5-10 mLs by mouth every 4 (four) hours as needed.             Activity: no heavy lifting for 3 weeks Diet: bariatric protein shake Wound Care: none needed  Follow-up:  With Dr. Johna Sheriff in 1 week.  Signed: Mariella Saa MD, FACS  06/17/2011, 8:56 AM

## 2011-06-24 ENCOUNTER — Other Ambulatory Visit: Payer: Self-pay | Admitting: Oncology

## 2011-06-24 DIAGNOSIS — C7951 Secondary malignant neoplasm of bone: Secondary | ICD-10-CM

## 2011-06-24 DIAGNOSIS — M542 Cervicalgia: Secondary | ICD-10-CM

## 2011-06-29 ENCOUNTER — Encounter: Payer: Medicare Other | Attending: General Surgery | Admitting: *Deleted

## 2011-06-29 DIAGNOSIS — Z01818 Encounter for other preprocedural examination: Secondary | ICD-10-CM | POA: Insufficient documentation

## 2011-06-29 DIAGNOSIS — Z713 Dietary counseling and surveillance: Secondary | ICD-10-CM | POA: Insufficient documentation

## 2011-06-29 NOTE — Progress Notes (Signed)
  Bariatric Class:  Appt start time: 1600 end time:  1700.  2 Week Post-Operative Nutrition Class  Patient was seen on 06/29/2011 for Post-Operative Nutrition education at [facility].   Surgery date: 06/15/11 Surgery type: Gastric Bypass  Weight today: 243.1 lbs Weight change: 14.9 lbs BMI: 39.3  The following the learning objective met the patient during this course:   Identifies Phase 3A (Soft, High Proteins) Dietary Goals and will begin from 2 weeks post-operatively to 2 months post-operatively   Identifies appropriate sources of fluids and proteins   States protein recommendations and appropriate sources post-operatively  Identifies the need for appropriate texture modifications, mastication, and bite sizes when consuming solids  Identifies appropriate multivitamin and calcium sources post-operatively  Describes the need for physical activity post-operatively and will follow MD recommendations  States when to call healthcare provider regarding medication questions or post-operative complications  Handouts given during class include:  Phase 3A: Soft, High Protein Diet Handout  Band Fill Guidelines Handout  Follow-Up Plan: Patient will follow-up at [facility] in 6 weeks for 2 months post-op nutrition visit for diet advancement per MD.

## 2011-06-29 NOTE — Patient Instructions (Signed)
Patient to follow Phase 3A-Soft, High Protein Diet and follow-up at [facility] in 6 weeks for 2 months post-op nutrition visit for diet advancement. 

## 2011-07-01 ENCOUNTER — Other Ambulatory Visit (HOSPITAL_BASED_OUTPATIENT_CLINIC_OR_DEPARTMENT_OTHER): Payer: Medicare Other | Admitting: Lab

## 2011-07-01 ENCOUNTER — Ambulatory Visit (INDEPENDENT_AMBULATORY_CARE_PROVIDER_SITE_OTHER): Payer: Medicare Other | Admitting: General Surgery

## 2011-07-01 ENCOUNTER — Encounter (INDEPENDENT_AMBULATORY_CARE_PROVIDER_SITE_OTHER): Payer: Self-pay | Admitting: General Surgery

## 2011-07-01 DIAGNOSIS — C7490 Malignant neoplasm of unspecified part of unspecified adrenal gland: Secondary | ICD-10-CM

## 2011-07-01 NOTE — Progress Notes (Signed)
Patient returns for her first postop visit 2 weeks following laparoscopic Roux-en-Y gastric bypass. She has been getting along quite well. She was just started her solid diet without difficulty. She's not had significant pain or vomiting. No fever.  On exam she appears well. Weight is 1021 pounds from preop at 244. She is afebrile vital signs are all normal. Abdomen is soft and nontender her wounds healing well.  Assessment and plan: Doing well following laparoscopic Roux-en-Y gastric bypass. Will return in one month

## 2011-07-05 ENCOUNTER — Ambulatory Visit (HOSPITAL_BASED_OUTPATIENT_CLINIC_OR_DEPARTMENT_OTHER): Payer: Medicare Other | Admitting: Nurse Practitioner

## 2011-07-05 VITALS — BP 110/72 | HR 58 | Temp 98.3°F | Ht 66.0 in | Wt 244.6 lb

## 2011-07-05 DIAGNOSIS — C759 Malignant neoplasm of endocrine gland, unspecified: Secondary | ICD-10-CM

## 2011-07-05 DIAGNOSIS — C801 Malignant (primary) neoplasm, unspecified: Secondary | ICD-10-CM

## 2011-07-05 DIAGNOSIS — D447 Neoplasm of uncertain behavior of aortic body and other paraganglia: Secondary | ICD-10-CM

## 2011-07-05 NOTE — Progress Notes (Signed)
OFFICE PROGRESS NOTE  Interval history:  [name] returns as scheduled. She continues to note intermittent right-sided head and neck pain. The pain has improved significantly since beginning Neurontin. She takes oxycodone as needed.  She reports undergoing a gastric bypass procedure 06/15/2011. She also reports that her gallbladder was removed at the time of surgery. She estimates losing approximately 20 pounds since surgery.   Objective: Blood pressure 110/72, pulse 58, temperature 98.3 F (36.8 C), temperature source Oral, height 5\' 6"  (1.676 m), weight 244 lb 9.6 oz (110.95 kg), last menstrual period 06/01/2011.  Oropharynx is without thrush or ulceration. No palpable cervical, supraclavicular or axillary lymph nodes. Lungs are clear. Regular cardiac rhythm. Abdomen soft and nontender. No organomegaly. Extremities are without edema.  Lab Results: Lab Results  Component Value Date   WBC 9.3 06/17/2011   HGB 11.6* 06/17/2011   HCT 34.8* 06/17/2011   MCV 91.1 06/17/2011   PLT 168 06/17/2011    Chemistry:    Chemistry      Component Value Date/Time   NA 139 06/11/2011 0900   K 4.4 06/11/2011 0900   CL 106 06/11/2011 0900   CO2 23 06/11/2011 0900   BUN 24* 06/11/2011 0900   CREATININE 1.34* 06/11/2011 0900      Component Value Date/Time   CALCIUM 9.8 06/11/2011 0900   ALKPHOS 231* 06/11/2011 0900   AST 50* 06/11/2011 0900   ALT 42* 06/11/2011 0900   BILITOT 0.4 06/11/2011 0900       Studies/Results: Dg Chest 2 View  06/11/2011  *RADIOLOGY REPORT*  Clinical Data: Preop gastric bypass  CHEST - 2 VIEW  Comparison: 05/07/2005  Findings: Heart size normal.  Postop changes involving the right posterior ribs again noted, stable.  No pleural effusion or pulmonary edema.  No airspace consolidation identified.  IMPRESSION: No acute cardiopulmonary abnormalities.  Original Report Authenticated By: Rosealee Albee, M.D.   Dg Cholangiogram Operative  06/15/2011  *RADIOLOGY REPORT*  Clinical Data:    Gallstones, right upper quadrant pain.  INTRAOPERATIVE CHOLANGIOGRAM  Comparison: Ultrasound 06/14/2011  Findings: The mildly prominent common bile duct and central intrahepatic ducts.  On the final cine images, small filling defects are seen within the central right hepatic duct.  It is unclear if this represents gas bubbles or small stones.  No evidence of obstruction as contrast passes into the small bowel.  IMPRESSION: Mildly prominent biliary system.  Small filling defects within the right hepatic duct on final images.  These could represent gas bubbles or retained stones.  These images were submitted for radiologic interpretation only. Please see the procedural report for the amount of contrast and the fluoroscopy time utilized.  Original Report Authenticated By: Cyndie Chime, M.D.   US Abdomen Complete  06/14/2011  *RADIOLOGY REPORT*  Clinical Data:  Morbid obesity.  Pre-op evaluation for bariatric surgery.  ABDOMINAL ULTRASOUND COMPLETE  Comparison:  None  Findings: Technically difficult exam due to large patient habitus.  Gallbladder:  A wall-echo-shadow complex is noted in the gallbladder fossa, consistent with gallstones filling the gallbladder. Gallbladder wallis difficult to visualize, but no pericholecystic fluid seen.  Common Bile Duct:  Within normal limits in caliber. Measures 4 mm in diameter.  Liver: No focal mass lesion identified.  Within normal limits in parenchymal echogenicity.  IVC:  Not well visualized due to habitus.  Pancreas:  No abnormality identified.  Spleen:  Not well visualized due to habitus.  Spleen appears small in size,but no definite abnormality identified.  Right kidney:  Normal in size and parenchymal echogenicity.  No evidence of mass or hydronephrosis.  Left kidney:  Normal in size and parenchymal echogenicity.  No evidence of mass or hydronephrosis.  Abdominal Aorta:  No aneurysm identified.  IMPRESSION:  1. Gallstones nearly filling the gallbladder. 2.  No evidence of  biliary dilatation or other significant abnormality.  Original Report Authenticated By: Danae Orleans, M.D.   Dg Kayleen Memos W/water Sol Cm  06/16/2011  *RADIOLOGY REPORT*  Clinical Data: Four status post gastric bypass and cholecystectomy 06/15/2011.  ESOPHAGUS WITH WATER SOLUTION CONTRAST  Comparison: None.  Findings: 50 ml Omnipaque-300 were administered by mouth.  1.2 minutes of fluoroscopy time was used.  The patient's gastric remnant is unremarkable in appearance.  Both the efferent and afferent loops appears somewhat dilated. No leakage of contrast material about the gastric remnant is seen.  During observation in radiology for approximately 40 minutes, contrast material would not pass beyond the proximal most portion the efferent loop. Subsequent imaging demonstrates contrast in distal small bowel.  No evidence of leak is identified.  IMPRESSION: Status post gastric bypass without evidence of complication.  Original Report Authenticated By: Bernadene Sisneros. Maricela Curet, M.D.    Medications: I have reviewed the patient's current medications.  Assessment/Plan:  1. 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  Restaging CT scans and a bone scan at [facility] in March 2011 revealed no evidence for disease progression.  The serum metanephrine, catecholamine and Chromogranin A levels remained elevated on 10/22/2010. 2. Intermittent sharp pain at the right face and neck.  Potentially related to nerve injury from the surgery/radiation of the skull base versus the skull base mass.  The pain has improved with Neurontin. 3. History of pancytopenia secondary to MIBG therapy. 4. Chronic sinus drainage and headache status post sinus surgery by Dr. Patterson Hammersmith at [facility] in July 2009. This was followed by nasal endoscopy and debridement in September 2009.  She underwent a repeat debridement on 10/22/2009. 5. Hypertension secondary to the paraganglioma. 6. Status post Cesarean section delivery  October 2007. 7. History of oral candidiasis. 8. Osteopenia on a bone density scan 10/13/2010 with a low estradiol level.  9. Status post gastric bypass surgery and cholecystectomy 06/15/2011.  Disposition-Ms. Waltz appears stable. We will followup on the serum metanephrine, catecholamine and chromogranin A levels from last week as well as the 24-hour urine result from today. She will return for a followup visit in 3 months. She will contact the office in the interim with any problems.  Plan reviewed with Dr. Truett Perna.  [name] ANP/GNP-BC

## 2011-07-06 LAB — CATECHOLAMINES, FRACTIONATED, PLASMA: Norepinephrine: 1994 pg/mL

## 2011-07-06 LAB — METANEPHRINES, PLASMA: Metanephrine, Free: <25 pg/mL (ref <=57)

## 2011-07-10 LAB — VMA, URINE, 24 HOUR
Creatinine 24h urine: 1.14 g/(24.h) (ref 0.63–2.50)
Vanillylmandelic Acid, (VMA): 3.9 mg/24 h (ref <=6.0)
Volume, Urine-VMAUR: 500 mL

## 2011-07-21 ENCOUNTER — Other Ambulatory Visit: Payer: Self-pay | Admitting: Internal Medicine

## 2011-07-21 DIAGNOSIS — Z1231 Encounter for screening mammogram for malignant neoplasm of breast: Secondary | ICD-10-CM

## 2011-08-10 ENCOUNTER — Encounter: Payer: Medicare Other | Attending: General Surgery | Admitting: *Deleted

## 2011-08-10 ENCOUNTER — Encounter: Payer: Self-pay | Admitting: *Deleted

## 2011-08-10 DIAGNOSIS — Z01818 Encounter for other preprocedural examination: Secondary | ICD-10-CM | POA: Insufficient documentation

## 2011-08-10 DIAGNOSIS — Z713 Dietary counseling and surveillance: Secondary | ICD-10-CM | POA: Insufficient documentation

## 2011-08-10 NOTE — Progress Notes (Signed)
  Follow-up visit: 8 Weeks Post-Operative Gastric Bypass Surgery  Medical Nutrition Therapy:  Appt start time: 0800 end time:  0828.  Assessment:  Primary concerns today: post-operative bariatric surgery nutrition management. Pt reports that since surgery she has had multiple episodes of n/v due to tough, dry meat and "re-learning" how to chew/swallow. She aims to get most of her protein from softer meat and protein supplements. Her job limits her from being able to eat regularly. She notes that her energy levels continue to be low with limited exercise.  Surgery date: 06/15/11  Surgery type: Gastric Bypass   Weight today: 236.2 Weight change: 6.9 lbs Total weight lost: 21.8 lbs BMI: 38.2%  24-hr recall:  B (AM): 1/2 protein shake OR 2 pc. [place] bacon Snk (AM): n/a   L (PM): 2 oz deli meat OR 1/2 cup tuna salad Snk (PM): n/a  D (PM): Lean beef (1-2 oz), 1/4 cup squash OR Ground beef w/ 1/2 cup pinto beans Snk (PM): n/a  Fluid intake: water, sugar-free drink mixes, protein shake = 24-32 oz total Estimated total protein intake: 45g  Medications: No changes at this point  Supplementation: Taking regularly  Using straws: No Drinking while eating: No Hair loss: None reported  Carbonated beverages: No N/V/D/C: Nausea/omiting reported with dry meat Dumping syndrome: None reported  Recent physical activity:  No structured activity reported.  Progress Towards Goal(s):  In progress.  Handouts given during visit include:  Phase 3B - High Protein + Non-Starchy Vegetables   Nutritional Diagnosis:  NI-5.7.1 Inadequate protein intake As related to recent GBP surgery and limited protein rich food choices.  As evidenced by pt meeting <75% of estimated protein need.    Intervention:  Nutrition education.  Monitoring/Evaluation:  Dietary intake, exercise, and body weight. Follow up in 1-2 months for 3-4 month post-op visit.

## 2011-08-10 NOTE — Patient Instructions (Signed)
Goals:  Follow Phase 3B: High Protein + Non-Starchy Vegetables  Eat 3-6 small meals/snacks, every 3-5 hrs  Increase lean protein foods to meet 60-80g goal  Increase fluid intake to 64oz +  Avoid drinking 15 minutes before, during and 30 minutes after eating  Aim for >30 min of physical activity daily 

## 2011-08-16 ENCOUNTER — Ambulatory Visit: Payer: Medicare Other

## 2011-08-20 ENCOUNTER — Ambulatory Visit (INDEPENDENT_AMBULATORY_CARE_PROVIDER_SITE_OTHER): Payer: Medicare Other | Admitting: General Surgery

## 2011-08-20 ENCOUNTER — Telehealth (INDEPENDENT_AMBULATORY_CARE_PROVIDER_SITE_OTHER): Payer: Self-pay | Admitting: General Surgery

## 2011-08-20 NOTE — Progress Notes (Signed)
History: Patient returns now 2 months following laparoscopic Roux-en-Y gastric bypass and cholecystectomy. She continues to do very well. She is tolerating most all of her high-protein food but has trouble with chicken. Bowels are moving okay. She has a low bloating but no vomiting or abdominal pain. Energy is improving.  Exam: BP 112/74  Pulse 68  Temp(Src) 97.4 F (36.3 C) (Temporal)  Resp 18  Ht 5\' 6"  (1.676 m)  Wt 232 lb 3.2 oz (105.325 kg)  BMI 37.48 kg/m2 Total weight loss  32 pounds General: Appears well in no distress Abdomen: Soft and nontender. Wounds all well healed.  Assessment and plan: Doing very well at this point following gastric bypass with good ongoing weight loss and no evidence of complications. Return in one month.

## 2011-08-23 ENCOUNTER — Ambulatory Visit
Admission: RE | Admit: 2011-08-23 | Discharge: 2011-08-23 | Disposition: A | Discharge status: Home / Self Care | Payer: Medicare Other | Source: Ambulatory Visit | Attending: Internal Medicine | Admitting: Internal Medicine

## 2011-08-23 DIAGNOSIS — Z1231 Encounter for screening mammogram for malignant neoplasm of breast: Secondary | ICD-10-CM

## 2011-08-31 NOTE — Telephone Encounter (Signed)
Appointment made for 10/01/11 @ 9:30 am w/Dr. Johna Sheriff

## 2011-09-07 ENCOUNTER — Ambulatory Visit: Payer: Medicare Other | Admitting: *Deleted

## 2011-09-08 ENCOUNTER — Ambulatory Visit: Payer: Medicare Other | Admitting: *Deleted

## 2011-10-01 ENCOUNTER — Encounter (INDEPENDENT_AMBULATORY_CARE_PROVIDER_SITE_OTHER): Payer: Self-pay | Admitting: General Surgery

## 2011-10-01 ENCOUNTER — Ambulatory Visit (INDEPENDENT_AMBULATORY_CARE_PROVIDER_SITE_OTHER): Payer: Medicare Other | Admitting: General Surgery

## 2011-10-01 DIAGNOSIS — K912 Postsurgical malabsorption, not elsewhere classified: Secondary | ICD-10-CM

## 2011-10-01 LAB — CBC WITH DIFFERENTIAL/PLATELET
Basophils Relative: 0 % (ref 0–1)
Eosinophils Absolute: 0.1 10*3/uL (ref 0.0–0.7)
MCH: 30.2 pg (ref 26.0–34.0)
MCHC: 31.5 g/dL (ref 30.0–36.0)
Neutrophils Relative %: 50 % (ref 43–77)
Platelets: 206 10*3/uL (ref 150–400)

## 2011-10-01 LAB — COMPREHENSIVE METABOLIC PANEL
AST: 20 U/L (ref 0–37)
Albumin: 4.2 g/dL (ref 3.5–5.2)
Alkaline Phosphatase: 235 U/L — ABNORMAL HIGH (ref 39–117)
Calcium: 8.8 mg/dL (ref 8.4–10.5)
Chloride: 112 mEq/L (ref 96–112)
Potassium: 3.9 mEq/L (ref 3.5–5.3)
Sodium: 141 mEq/L (ref 135–145)
Total Protein: 7.1 g/dL (ref 6.0–8.3)

## 2011-10-01 LAB — LIPID PANEL
Cholesterol: 168 mg/dL (ref 0–200)
VLDL: 15 mg/dL (ref 0–40)

## 2011-10-01 LAB — MAGNESIUM: Magnesium: 2.1 mg/dL (ref 1.5–2.5)

## 2011-10-01 LAB — IRON AND TIBC
%SAT: 32 % (ref 20–55)
Iron: 76 ug/dL (ref 42–145)
UIBC: 159 ug/dL (ref 125–400)

## 2011-10-01 NOTE — Progress Notes (Signed)
Addended by: Maryan Puls on: 10/01/2011 10:09 AM   Modules accepted: Orders

## 2011-10-01 NOTE — Progress Notes (Signed)
Chief complaint: Followup gastric bypass  History: The patient returns for routine followup at 3 months following laparoscopic Roux-en-Y gastric bypass. She is doing quite well. She is tolerating her protein diet without pain or nausea. She has some bloating and gas. Bowel movements are okay. Her energy level is good and she is exercising regularly.  Exam: BP 118/86  Pulse 66  Temp(Src) 97.3 F (36.3 C) (Temporal)  Ht 5\' 6"  (1.676 m)  Wt 218 lb 3.2 oz (98.975 kg)  BMI 35.22 kg/m2  SpO2 98% Total weight loss 40 pounds General: Appears well Lungs: Clear and equal breath sounds Cardiac: Regular rate and rhythm. No edema Abdomen: Soft and nontender. Wounds well healed. No hernias.  Assessment and plan: Doing very well following gastric bypass with very good weight loss and no complications identified. We will check lab work today. Return in 3 months.

## 2011-10-01 NOTE — Patient Instructions (Signed)
Continue to concentrate on a solid high protein diet and regular exercise

## 2011-10-04 ENCOUNTER — Telehealth: Payer: Self-pay | Admitting: Oncology

## 2011-10-04 ENCOUNTER — Ambulatory Visit (HOSPITAL_BASED_OUTPATIENT_CLINIC_OR_DEPARTMENT_OTHER): Payer: Medicare Other | Admitting: Oncology

## 2011-10-04 VITALS — BP 143/86 | HR 59 | Temp 97.8°F | Ht 66.0 in | Wt 218.5 lb

## 2011-10-04 DIAGNOSIS — D447 Neoplasm of uncertain behavior of aortic body and other paraganglia: Secondary | ICD-10-CM

## 2011-10-04 DIAGNOSIS — Z86018 Personal history of other benign neoplasm: Secondary | ICD-10-CM

## 2011-10-04 NOTE — Progress Notes (Signed)
OFFICE PROGRESS NOTE   INTERVAL HISTORY:   She returns as scheduled. She has lost a significant amount of weight after the gastric bypass surgery. She has intermittent "spasm "type discomfort at the right upper neck/low head. These episodes have decreased in frequency since beginning Neurontin. No other pain.  Objective:  Vital signs in last 24 hours:  Blood pressure 143/86, pulse 59, temperature 97.8 F (36.6 C), temperature source Oral, height 5\' 6"  (1.676 m), weight 218 lb 8 oz (99.111 kg).    HEENT: No sinus tenderness, neck without mass Lymphatics: No cervical, supraclavicular, or axillary nodes Resp: Lungs clear bilaterally Cardio: Regular rate and rhythm GI: No hepatosplenomegaly, no mass   Lab Results:  Lab Results  Component Value Date   WBC 4.6 10/01/2011   HGB 11.2* 10/01/2011   HCT 35.6* 10/01/2011   MCV 96.0 10/01/2011   PLT 206 10/01/2011      Medications: I have reviewed the patient's current medications.  Assessment/Plan: 1. 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 Restaging CT scans and a bone scan at [facility] in March 2011 revealed no evidence for disease progression. The serum metanephrine, catecholamine and Chromogranin A levels remained elevated on 10/22/2010. The chromogranin A level was normal on 07/01/2011 and the metanephrines were elevated. 2. Intermittent sharp pain at the right face and neck. Potentially related to nerve injury from the surgery/radiation of the skull base versus the skull base mass. The pain has improved with Neurontin. 3. History of pancytopenia secondary to MIBG therapy. 4. Chronic sinus drainage and headache status post sinus surgery by Dr. Patterson Hammersmith at [facility] in July 2009. This was followed by nasal endoscopy and debridement in September 2009. She underwent a repeat debridement on 10/22/2009. 5. History of hypertension secondary to the paraganglioma. 6. Status post Cesarean section delivery  October 2007. 7. History of oral candidiasis. 8. Osteopenia on a bone density scan 10/13/2010 with a low estradiol level.  9. Status post gastric bypass surgery and cholecystectomy 06/15/2011.   Disposition:  There is no clinical evidence for progression of the paraganglioma. She will return for an office visit and repeat hormone studies in 3 months.   Thornton Papas, MD  10/04/2011  12:08 PM

## 2011-10-04 NOTE — Telephone Encounter (Signed)
Gv pt appt for [id number] °

## 2011-11-08 ENCOUNTER — Ambulatory Visit: Payer: Medicare Other | Admitting: *Deleted

## 2011-12-14 ENCOUNTER — Other Ambulatory Visit: Payer: Self-pay | Admitting: Oncology

## 2011-12-14 DIAGNOSIS — Z862 Personal history of diseases of the blood and blood-forming organs and certain disorders involving the immune mechanism: Secondary | ICD-10-CM

## 2011-12-14 DIAGNOSIS — C7490 Malignant neoplasm of unspecified part of unspecified adrenal gland: Secondary | ICD-10-CM

## 2011-12-27 ENCOUNTER — Ambulatory Visit (HOSPITAL_BASED_OUTPATIENT_CLINIC_OR_DEPARTMENT_OTHER): Payer: Medicare Other

## 2011-12-27 DIAGNOSIS — Z862 Personal history of diseases of the blood and blood-forming organs and certain disorders involving the immune mechanism: Secondary | ICD-10-CM

## 2011-12-27 DIAGNOSIS — Z86018 Personal history of other benign neoplasm: Secondary | ICD-10-CM

## 2011-12-27 DIAGNOSIS — Z8639 Personal history of other endocrine, nutritional and metabolic disease: Secondary | ICD-10-CM

## 2011-12-30 ENCOUNTER — Ambulatory Visit (INDEPENDENT_AMBULATORY_CARE_PROVIDER_SITE_OTHER): Payer: Medicare Other | Admitting: General Surgery

## 2011-12-30 ENCOUNTER — Encounter (INDEPENDENT_AMBULATORY_CARE_PROVIDER_SITE_OTHER): Payer: Self-pay | Admitting: General Surgery

## 2011-12-30 DIAGNOSIS — Z9884 Bariatric surgery status: Secondary | ICD-10-CM

## 2011-12-30 NOTE — Progress Notes (Signed)
Chief complaint: Followup gastric bypass  History: Patient returns for her six-month followup status post laparoscopic Roux-en-Y gastric bypass. She continues to do well. She denies any significant or abdominal or GI complaints. Energy level is good.  Exam: BP 124/82  Pulse 68  Resp 16  Ht 5\' 6"  (1.676 m)  Wt 206 lb (93.441 kg)  BMI 33.25 kg/m2 Total weight loss 58 pounds General: Appears well Lungs: Clear equal breath sounds Abdomen: Soft and nontender. No hernias. Extremities: No edema  Assessment and plan: Doing well with good weight loss and no complications identified. [facility] by Dr. Myrle Sheng routine blood work. We will check iron and vitamin levels. We reviewed diet and exercise strategies. Return in 3 months.

## 2011-12-31 LAB — METANEPHRINES, URINE, 24 HOUR
Metaneph Total, Ur: 578 mcg/24 h (ref 182–739)
Metanephrines, Ur: 37 mcg/24 h — ABNORMAL LOW (ref 58–203)
Normetanephrine, 24H Ur: 541 mcg/24 h (ref 88–649)
Volume, Urine-METAN: 1800 mL

## 2012-01-03 ENCOUNTER — Ambulatory Visit: Payer: Medicare Other | Admitting: Nurse Practitioner

## 2012-01-04 ENCOUNTER — Telehealth: Payer: Self-pay | Admitting: Oncology

## 2012-01-04 NOTE — Telephone Encounter (Signed)
Returned pt's call and gv her new appt for 8/9.

## 2012-01-05 LAB — CATECHOLAMINES, FRACTIONATED, PLASMA: Catecholamines, Total: 1531 pg/mL

## 2012-01-05 LAB — CHROMOGRANIN A: Chromogranin A: 5 ng/mL (ref 1.9–15.0)

## 2012-01-14 ENCOUNTER — Telehealth: Payer: Self-pay | Admitting: *Deleted

## 2012-01-14 ENCOUNTER — Ambulatory Visit (HOSPITAL_BASED_OUTPATIENT_CLINIC_OR_DEPARTMENT_OTHER): Payer: Medicare Other | Admitting: Nurse Practitioner

## 2012-01-14 VITALS — BP 123/81 | HR 55 | Temp 98.1°F | Resp 20 | Ht 66.0 in | Wt 201.4 lb

## 2012-01-14 DIAGNOSIS — M542 Cervicalgia: Secondary | ICD-10-CM

## 2012-01-14 DIAGNOSIS — D447 Neoplasm of uncertain behavior of aortic body and other paraganglia: Secondary | ICD-10-CM

## 2012-01-14 DIAGNOSIS — M949 Disorder of cartilage, unspecified: Secondary | ICD-10-CM

## 2012-01-14 DIAGNOSIS — M899 Disorder of bone, unspecified: Secondary | ICD-10-CM

## 2012-01-14 DIAGNOSIS — C768 Malignant neoplasm of other specified ill-defined sites: Secondary | ICD-10-CM

## 2012-01-14 NOTE — Progress Notes (Signed)
OFFICE PROGRESS NOTE  Interval history:  [name] returns after missing a recent followup appointment. She feels well. No interim illnesses or infections. She continues to lose weight. She has noted improvement in her energy level since the gastric bypass surgery. She has periodic "sharp spasms" at the right neck. She notes significant improvement in the pain since beginning Neurontin. She has occasional headaches. No vision change.   Objective: Blood pressure 123/81, pulse 55, temperature 98.1 F (36.7 C), temperature source Oral, resp. rate 20, height 5\' 6"  (1.676 m), weight 201 lb 6.4 oz (91.354 kg).  Oropharynx is without thrush or ulceration. No palpable cervical, supraclavicular or axillary lymph nodes. Lungs are clear. No wheezes or rales. Regular cardiac rhythm. Abdomen is soft and nontender. No hepatomegaly. Extremities are without edema.  Lab Results: Lab Results  Component Value Date   WBC 4.6 10/01/2011   HGB 11.2* 10/01/2011   HCT 35.6* 10/01/2011   MCV 96.0 10/01/2011   PLT 206 10/01/2011    Chemistry:    Chemistry      Component Value Date/Time   NA 141 10/01/2011 1009   K 3.9 10/01/2011 1009   CL 112 10/01/2011 1009   CO2 19 10/01/2011 1009   BUN 16 10/01/2011 1009   CREATININE 1.11* 10/01/2011 1009   CREATININE 1.34* 06/11/2011 0900      Component Value Date/Time   CALCIUM 8.8 10/01/2011 1009   ALKPHOS 235* 10/01/2011 1009   AST 20 10/01/2011 1009   ALT 17 10/01/2011 1009   BILITOT 0.4 10/01/2011 1009       Studies/Results: No results found.  Medications: I have reviewed the patient's current medications.  Assessment/Plan:  1. 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 Restaging CT scans and a bone scan at [facility] in March 2011 revealed no evidence for disease progression. The serum metanephrine, catecholamine and Chromogranin A levels remained elevated on 10/22/2010. The chromogranin A level was normal on 07/01/2011 and the  metanephrines were elevated. The chromogranin A level was normal and serum catecholamine level improved on 12/27/2011. 2. Intermittent sharp pain at the right face and neck. Potentially related to nerve injury from the surgery/radiation of the skull base versus the skull base mass. The pain has improved with Neurontin. 3. History of pancytopenia secondary to MIBG therapy. 4. Chronic sinus drainage and headache status post sinus surgery by Dr. Patterson Hammersmith at [facility] in July 2009. This was followed by nasal endoscopy and debridement in September 2009. She underwent a repeat debridement on 10/22/2009. 5. History of hypertension secondary to the paraganglioma. 6. Status post Cesarean section delivery October 2007. 7. History of oral candidiasis. 8. Osteopenia on a bone density scan 10/13/2010 with a low estradiol level.  9. Status post gastric bypass surgery and cholecystectomy 06/15/2011.  Disposition-[name] appears stable. She will return for a followup visit in 3 months. She will contact the office in the interim with any problems.  [name] ANP/GNP-BC

## 2012-01-14 NOTE — Telephone Encounter (Signed)
Gave patient appointment for 04-17-2012 starting at 11:00am with labs

## 2012-04-17 ENCOUNTER — Telehealth: Payer: Self-pay | Admitting: Oncology

## 2012-04-17 ENCOUNTER — Other Ambulatory Visit: Payer: Medicare Other | Admitting: Lab

## 2012-04-17 ENCOUNTER — Ambulatory Visit (HOSPITAL_BASED_OUTPATIENT_CLINIC_OR_DEPARTMENT_OTHER): Payer: Medicare Other | Admitting: Nurse Practitioner

## 2012-04-17 ENCOUNTER — Ambulatory Visit: Payer: Medicare Other | Admitting: Oncology

## 2012-04-17 VITALS — BP 120/73 | HR 49 | Temp 97.9°F | Resp 20 | Ht 66.0 in | Wt 188.0 lb

## 2012-04-17 DIAGNOSIS — M542 Cervicalgia: Secondary | ICD-10-CM

## 2012-04-17 DIAGNOSIS — C768 Malignant neoplasm of other specified ill-defined sites: Secondary | ICD-10-CM

## 2012-04-17 DIAGNOSIS — M949 Disorder of cartilage, unspecified: Secondary | ICD-10-CM

## 2012-04-17 DIAGNOSIS — M899 Disorder of bone, unspecified: Secondary | ICD-10-CM

## 2012-04-17 DIAGNOSIS — Z86018 Personal history of other benign neoplasm: Secondary | ICD-10-CM

## 2012-04-17 MED ORDER — CYCLOBENZAPRINE HCL 5 MG PO TABS: 5.0000 mg | ORAL_TABLET | Freq: Three times a day (TID) | ORAL | Status: DC | PRN

## 2012-04-17 NOTE — Telephone Encounter (Signed)
appts made and printed for pt aom °

## 2012-04-17 NOTE — Progress Notes (Signed)
OFFICE PROGRESS NOTE  Interval history:  [name] returns as scheduled. She continues to lose weight. She overall feels well. She has a good appetite. The right-sided neck pain continues to be improved. She reports that it occurs occasionally and is less intense since beginning gabapentin. Over the past one week she has had a different type of "aching" pain at the left neck. Aleve eases the pain. She denies nausea/vomiting.   Objective: Blood pressure 120/73, pulse 49, temperature 97.9 F (36.6 C), temperature source Oral, resp. rate 20, height 5\' 6"  (1.676 m), weight 188 lb (85.276 kg).  Oropharynx is without thrush or ulceration. No palpable cervical or supraclavicular lymph nodes. Lungs are clear. Regular cardiac rhythm. Abdomen is soft and nontender. No organomegaly. Extremities are without edema. Calves are soft and nontender.  Lab Results: Lab Results  Component Value Date   WBC 4.6 10/01/2011   HGB 11.2* 10/01/2011   HCT 35.6* 10/01/2011   MCV 96.0 10/01/2011   PLT 206 10/01/2011    Chemistry:    Chemistry      Component Value Date/Time   NA 141 10/01/2011 1009   K 3.9 10/01/2011 1009   CL 112 10/01/2011 1009   CO2 19 10/01/2011 1009   BUN 16 10/01/2011 1009   CREATININE 1.11* 10/01/2011 1009   CREATININE 1.34* 06/11/2011 0900      Component Value Date/Time   CALCIUM 8.8 10/01/2011 1009   ALKPHOS 235* 10/01/2011 1009   AST 20 10/01/2011 1009   ALT 17 10/01/2011 1009   BILITOT 0.4 10/01/2011 1009       Studies/Results: No results found.  Medications: I have reviewed the patient's current medications.  Assessment/Plan:  1. 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 Restaging CT scans and a bone scan at [facility] in March 2011 revealed no evidence for disease progression. The serum metanephrine, catecholamine and Chromogranin A levels remained elevated on 10/22/2010. The chromogranin A level was normal on 07/01/2011 and the metanephrines were  elevated. The chromogranin A level was normal and serum catecholamine level improved on 12/27/2011. 2. Intermittent sharp pain at the right face and neck. Potentially related to nerve injury from the surgery/radiation of the skull base versus the skull base mass. The pain has improved with Neurontin. 3. History of pancytopenia secondary to MIBG therapy. 4. Chronic sinus drainage and headache status post sinus surgery by Dr. Patterson Hammersmith at [facility] in July 2009. This was followed by nasal endoscopy and debridement in September 2009. She underwent a repeat debridement on 10/22/2009. 5. History of hypertension secondary to the paraganglioma. 6. Status post Cesarean section delivery October 2007. 7. History of oral candidiasis. 8. Osteopenia on a bone density scan 10/13/2010 with a low estradiol level.  9. Status post gastric bypass surgery and cholecystectomy 06/15/2011. 10. One week history of "aching" left-sided neck pain. She will try Flexeril 5 mg 3 times daily as needed. She was instructed that she should not be driving while taking this medication.  Disposition-[name] appears stable. She will return for a followup visit in 3 months. We will obtain repeat plasma catecholamine and chromogranin A levels 1-2 weeks prior to that visit. She will contact the office in the interim with any problems. We specifically discussed persistent and/or worsening neck pain.  Plan reviewed with Dr. Truett Perna.  [name] ANP/GNP-BC

## 2012-07-02 ENCOUNTER — Other Ambulatory Visit: Payer: Self-pay | Admitting: Oncology

## 2012-07-03 ENCOUNTER — Other Ambulatory Visit: Payer: Self-pay | Admitting: Oncology

## 2012-07-04 ENCOUNTER — Other Ambulatory Visit (HOSPITAL_BASED_OUTPATIENT_CLINIC_OR_DEPARTMENT_OTHER): Payer: Medicare Other

## 2012-07-04 DIAGNOSIS — K219 Gastro-esophageal reflux disease without esophagitis: Secondary | ICD-10-CM

## 2012-07-04 DIAGNOSIS — C7490 Malignant neoplasm of unspecified part of unspecified adrenal gland: Secondary | ICD-10-CM

## 2012-07-04 DIAGNOSIS — Z8639 Personal history of other endocrine, nutritional and metabolic disease: Secondary | ICD-10-CM

## 2012-07-04 DIAGNOSIS — Z862 Personal history of diseases of the blood and blood-forming organs and certain disorders involving the immune mechanism: Secondary | ICD-10-CM

## 2012-07-04 DIAGNOSIS — Z86018 Personal history of other benign neoplasm: Secondary | ICD-10-CM

## 2012-07-09 LAB — CATECHOLAMINES, FRACTIONATED, PLASMA: Norepinephrine: 697 pg/mL

## 2012-07-18 ENCOUNTER — Ambulatory Visit (HOSPITAL_BASED_OUTPATIENT_CLINIC_OR_DEPARTMENT_OTHER): Payer: Medicare Other | Admitting: Oncology

## 2012-07-18 ENCOUNTER — Telehealth: Payer: Self-pay | Admitting: Oncology

## 2012-07-18 VITALS — BP 120/81 | HR 58 | Temp 99.5°F | Resp 18 | Ht 66.0 in | Wt 176.5 lb

## 2012-07-18 DIAGNOSIS — C768 Malignant neoplasm of other specified ill-defined sites: Secondary | ICD-10-CM

## 2012-07-18 DIAGNOSIS — Z86018 Personal history of other benign neoplasm: Secondary | ICD-10-CM

## 2012-07-18 DIAGNOSIS — Z9884 Bariatric surgery status: Secondary | ICD-10-CM

## 2012-07-18 DIAGNOSIS — M899 Disorder of bone, unspecified: Secondary | ICD-10-CM

## 2012-07-18 NOTE — Progress Notes (Signed)
   [facility]    OFFICE PROGRESS NOTE   INTERVAL HISTORY:   She returns as scheduled. She continues to lose weight after the gastric bypass procedure. Good appetite. In frequent headaches. The right head/neck pain improved when she started Neurontin. No palpitations. She is not using the Flexeril.  Objective:  Vital signs in last 24 hours:  Blood pressure 120/81, pulse 58, temperature 99.5 F (37.5 C), temperature source Oral, resp. rate 18, height 5\' 6"  (1.676 m), weight 176 lb 8 oz (80.06 kg).    HEENT: Neck without mass Lymphatics: No cervical, supraclavicular, or axillary nodes Resp: Lungs clear bilaterally Cardio: Regular rate and rhythm GI: No hepatosplenomegaly, no mass Vascular: No leg edema   Lab Results:  07/04/2012-epinephrine 53, norepinephrine 697, catecholamines 750, chromogranin A 5.6   Medications: I have reviewed the patient's current medications.  Assessment/Plan: 1. 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 Restaging CT scans and a bone scan at [facility] in March 2011 revealed no evidence for disease progression. The serum metanephrine, catecholamine and Chromogranin A levels remained elevated on 10/22/2010. The chromogranin A level was normal on 07/01/2011 and the metanephrines were elevated. The chromogranin A level was normal and serum catecholamine level improved on 12/27/2011. 2. Intermittent sharp pain at the right face and neck. Potentially related to nerve injury from the surgery/radiation of the skull base versus the skull base mass. The pain has improved with Neurontin. 3. History of pancytopenia secondary to MIBG therapy. 4. Chronic sinus drainage and headache status post sinus surgery by Dr. Patterson Hammersmith at [facility] in July 2009. This was followed by nasal endoscopy and debridement in September 2009. She underwent a repeat debridement on 10/22/2009. 5. History of hypertension secondary to the  paraganglioma. 6. Status post Cesarean section delivery October 2007. 7. History of oral candidiasis. 8. Osteopenia on a bone density scan 10/13/2010 with a low estradiol level.  9. Status post gastric bypass surgery and cholecystectomy 06/15/2011.   Disposition:  She appears stable. No clinical evidence for progression of the paraganglioma. The serum hormone levels appear improved. She will contact us for new symptoms. [name] will return for an office visit in 6 months.   Thornton Papas, MD  07/18/2012  12:30 PM

## 2012-07-18 NOTE — Telephone Encounter (Signed)
Talked to pt gave her appt for August 2014 lab 2 weeks before ML visit per MD

## 2012-09-28 ENCOUNTER — Other Ambulatory Visit: Payer: Self-pay

## 2012-09-28 DIAGNOSIS — Z1231 Encounter for screening mammogram for malignant neoplasm of breast: Secondary | ICD-10-CM

## 2012-10-03 ENCOUNTER — Ambulatory Visit
Admission: RE | Admit: 2012-10-03 | Discharge: 2012-10-03 | Disposition: A | Discharge status: Home / Self Care | Payer: Medicare Other | Source: Ambulatory Visit

## 2012-10-03 DIAGNOSIS — Z1231 Encounter for screening mammogram for malignant neoplasm of breast: Secondary | ICD-10-CM

## 2012-10-12 ENCOUNTER — Ambulatory Visit (INDEPENDENT_AMBULATORY_CARE_PROVIDER_SITE_OTHER): Payer: Medicare Other | Admitting: Obstetrics & Gynecology

## 2012-10-12 ENCOUNTER — Encounter: Payer: Self-pay | Admitting: Obstetrics & Gynecology

## 2012-10-12 VITALS — BP 110/72 | HR 71 | Temp 99.0°F | Ht 66.0 in | Wt 170.0 lb

## 2012-10-12 DIAGNOSIS — Z01419 Encounter for gynecological examination (general) (routine) without abnormal findings: Secondary | ICD-10-CM

## 2012-10-12 DIAGNOSIS — R351 Nocturia: Secondary | ICD-10-CM

## 2012-10-12 DIAGNOSIS — Z113 Encounter for screening for infections with a predominantly sexual mode of transmission: Secondary | ICD-10-CM

## 2012-10-12 LAB — POCT URINALYSIS DIPSTICK
Bilirubin, UA: NEGATIVE
Blood, UA: NEGATIVE
Glucose, UA: NEGATIVE
Leukocytes, UA: NEGATIVE
Nitrite, UA: NEGATIVE
Urobilinogen, UA: NEGATIVE

## 2012-10-12 NOTE — Progress Notes (Signed)
Subjective:     [name] is a 42 y.o. female here for a routine exam.  Current complaints: nocturia x months.    Gynecologic History Patient's last menstrual period was 10/02/2012. Contraception: tubal ligation and OCP Last Pap: 09/28/2010. Results were: normal Last mammogram: 10/03/2012. Results were: normal   The following portions of the patient's history were reviewed and updated as appropriate: allergies, current medications, past family history, past medical history, past social history, past surgical history and problem list.  Review of Systems Pertinent items are noted in HPI.    Objective:    General appearance: alert Breasts: normal appearance, no masses or tenderness Abdomen: soft, non-tender; bowel sounds normal; no masses,  no organomegaly Pelvic: cervix normal in appearance, external genitalia normal, no adnexal masses or tenderness, uterus normal size, shape, and consistency and vagina with thin white discharge    Assessment:    Healthy female exam.    Plan:  Return prn

## 2012-10-12 NOTE — Addendum Note (Signed)
Addended by: George Hugh on: 10/12/2012 06:00 PM   Modules accepted: Orders

## 2012-10-13 LAB — PAP IG AND HPV HIGH-RISK

## 2012-10-19 ENCOUNTER — Other Ambulatory Visit: Payer: Self-pay | Admitting: Obstetrics & Gynecology

## 2012-11-13 ENCOUNTER — Telehealth: Payer: Self-pay | Admitting: Oncology

## 2012-11-13 ENCOUNTER — Other Ambulatory Visit: Payer: Self-pay | Admitting: *Deleted

## 2012-11-13 DIAGNOSIS — D649 Anemia, unspecified: Secondary | ICD-10-CM

## 2012-11-13 NOTE — Progress Notes (Signed)
Received fax from Dr. Jone Baseman office. Pt's HGB 8.3. Orders entered for Lab/office visit within next 2 weeks.

## 2012-11-21 ENCOUNTER — Telehealth: Payer: Self-pay | Admitting: Oncology

## 2012-11-21 ENCOUNTER — Other Ambulatory Visit: Payer: Medicare Other | Admitting: Lab

## 2012-11-21 ENCOUNTER — Ambulatory Visit: Payer: Medicare Other | Admitting: Oncology

## 2012-11-21 NOTE — Telephone Encounter (Signed)
Pt called and r/s appt for today to july 24th lab and MD ,nurse notified

## 2012-12-16 ENCOUNTER — Other Ambulatory Visit: Payer: Self-pay | Admitting: Oncology

## 2012-12-16 DIAGNOSIS — M542 Cervicalgia: Secondary | ICD-10-CM

## 2012-12-16 DIAGNOSIS — Z862 Personal history of diseases of the blood and blood-forming organs and certain disorders involving the immune mechanism: Secondary | ICD-10-CM

## 2012-12-28 ENCOUNTER — Ambulatory Visit (HOSPITAL_BASED_OUTPATIENT_CLINIC_OR_DEPARTMENT_OTHER): Payer: Medicare Other | Admitting: Oncology

## 2012-12-28 ENCOUNTER — Other Ambulatory Visit (HOSPITAL_BASED_OUTPATIENT_CLINIC_OR_DEPARTMENT_OTHER): Payer: Medicare Other | Admitting: Lab

## 2012-12-28 VITALS — BP 110/66 | HR 59 | Temp 98.5°F | Resp 18 | Ht 66.0 in | Wt 167.5 lb

## 2012-12-28 DIAGNOSIS — D649 Anemia, unspecified: Secondary | ICD-10-CM

## 2012-12-28 DIAGNOSIS — Z86018 Personal history of other benign neoplasm: Secondary | ICD-10-CM

## 2012-12-28 LAB — CBC WITH DIFFERENTIAL/PLATELET
BASO%: 0.7 % (ref 0.0–2.0)
EOS%: 3.6 % (ref 0.0–7.0)
LYMPH%: 49.4 % (ref 14.0–49.7)
MCHC: 33.6 g/dL (ref 31.5–36.0)
MONO#: 0.3 10*3/uL (ref 0.1–0.9)
Platelets: 153 10*3/uL (ref 145–400)
RBC: 3.28 10*6/uL — ABNORMAL LOW (ref 3.70–5.45)
WBC: 4 10*3/uL (ref 3.9–10.3)

## 2012-12-28 LAB — CHCC SMEAR

## 2012-12-28 NOTE — Progress Notes (Signed)
   [facility]    OFFICE PROGRESS NOTE   INTERVAL HISTORY:   She returns as scheduled. When she saw Dr. Valentina Lucks within the past few months the hemoglobin was low (we cannot locate the CBC results today). She reports a stool Hemoccult was negative. She denies bleeding. Her weight has stabilized. She reports malaise. She started iron. Stable right sided head/neck discomfort.  Objective:  Vital signs in last 24 hours:  Blood pressure 110/66, pulse 59, temperature 98.5 F (36.9 C), temperature source Oral, resp. rate 18, height 5\' 6"  (1.676 m), weight 167 lb 8 oz (75.978 kg).    HEENT: Neck without mass Lymphatics: No cervical, supra-clavicular, or axillary nodes Resp: Lungs clear bilaterally Cardio: Regular rate and rhythm GI: No hepatosplenomegaly, nontender, no mass Vascular: No leg edema   Lab Results:  Lab Results  Component Value Date   WBC 4.0 12/28/2012   HGB 10.3* 12/28/2012   HCT 30.7* 12/28/2012   MCV 93.4 12/28/2012   PLT 153 12/28/2012   ANC 1.6  Blood smear: The polychromasia is not increased. A few teardrops and target cells. The white cell morphology is unremarkable. The platelets appear normal in number.  Medications: I have reviewed the patient's current medications.  Assessment/Plan: 1. 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 Restaging CT scans and a bone scan at [facility] in March 2011 revealed no evidence for disease progression. The serum metanephrine, catecholamine and Chromogranin A levels remained elevated on 10/22/2010. The chromogranin A level was normal on 07/01/2011 and the metanephrines were elevated. The chromogranin A level was normal on 06/26/2012 and serum catecholamine levels improved on 07/04/2012 2. Intermittent sharp pain at the right face and neck. Potentially related to nerve injury from the surgery/radiation of the skull base versus the skull base mass. The pain has improved with  Neurontin. 3. History of pancytopenia secondary to MIBG therapy. 4. Chronic sinus drainage and headache status post sinus surgery by Dr. Patterson Hammersmith at [facility] in July 2009. This was followed by nasal endoscopy and debridement in September 2009. She underwent a repeat debridement on 10/22/2009. 5. History of hypertension secondary to the paraganglioma. 6. Status post Cesarean section delivery October 2007. 7. History of oral candidiasis. 8. Osteopenia on a bone density scan 10/13/2010 with a low estradiol level.  9. Status post gastric bypass surgery and cholecystectomy 06/15/2011. 10. Anemia-? Related to the gastric bypass surgery with an element of malnutrition. The hemoglobin is higher compared to a level from Dr. Jone Baseman office. The differential diagnosis includes myelodysplasia and less likely metastatic paraganglioma involving the bone marrow.  Disposition:  She remains in clinical remission from the paraganglioma. She has developed anemia. The etiology of the anemia is unclear. She will return for an office visit and repeat CBC in approximately one month. She is scheduled for repeat hormone levels in the interim. She will continue an iron supplement.   Thornton Papas, MD  12/28/2012  5:00 PM

## 2013-01-15 ENCOUNTER — Ambulatory Visit: Payer: Medicare Other | Admitting: Oncology

## 2013-01-15 ENCOUNTER — Other Ambulatory Visit (HOSPITAL_BASED_OUTPATIENT_CLINIC_OR_DEPARTMENT_OTHER): Payer: Medicare Other

## 2013-01-15 DIAGNOSIS — Z86018 Personal history of other benign neoplasm: Secondary | ICD-10-CM

## 2013-01-15 DIAGNOSIS — D649 Anemia, unspecified: Secondary | ICD-10-CM

## 2013-01-15 LAB — CBC WITH DIFFERENTIAL/PLATELET
BASO%: 0.7 % (ref 0.0–2.0)
EOS%: 3.5 % (ref 0.0–7.0)
MCH: 31.3 pg (ref 25.1–34.0)
MCHC: 33.3 g/dL (ref 31.5–36.0)
NEUT%: 45.4 % (ref 38.4–76.8)
RBC: 3.07 10*6/uL — ABNORMAL LOW (ref 3.70–5.45)
RDW: 14.1 % (ref 11.2–14.5)
lymph#: 2 10*3/uL (ref 0.9–3.3)

## 2013-01-23 ENCOUNTER — Telehealth: Payer: Self-pay | Admitting: Oncology

## 2013-01-23 NOTE — Telephone Encounter (Signed)
Add to previous note. Per 8/15 pof sent for ZO#[id number] move [place] 1 wk out. lmonvm for pt re change and new appt for 9/2 and mailed schedule.

## 2013-01-23 NOTE — Telephone Encounter (Signed)
Per 8/15 pof sent for ZO#[id number] move [place] 1 wk out.

## 2013-01-29 ENCOUNTER — Ambulatory Visit: Payer: Medicare Other | Admitting: Nurse Practitioner

## 2013-01-30 LAB — METANEPHRINES, URINE, 24 HOUR
Metaneph Total, Ur: 441 mcg/24 h (ref 182–739)
Metanephrines, Ur: 30 mcg/24 h — ABNORMAL LOW (ref 58–203)
Normetanephrine, 24H Ur: 411 mcg/24 h (ref 88–649)
Volume, Urine-METAN: 1550 mL

## 2013-02-06 ENCOUNTER — Ambulatory Visit (HOSPITAL_BASED_OUTPATIENT_CLINIC_OR_DEPARTMENT_OTHER): Payer: Medicare Other | Admitting: Nurse Practitioner

## 2013-02-06 ENCOUNTER — Telehealth: Payer: Self-pay

## 2013-02-06 ENCOUNTER — Ambulatory Visit (HOSPITAL_BASED_OUTPATIENT_CLINIC_OR_DEPARTMENT_OTHER): Payer: Medicare Other

## 2013-02-06 VITALS — BP 101/62 | HR 57 | Temp 98.1°F | Resp 19 | Ht 66.0 in | Wt 171.2 lb

## 2013-02-06 DIAGNOSIS — D649 Anemia, unspecified: Secondary | ICD-10-CM

## 2013-02-06 DIAGNOSIS — Z86018 Personal history of other benign neoplasm: Secondary | ICD-10-CM

## 2013-02-06 DIAGNOSIS — Z9884 Bariatric surgery status: Secondary | ICD-10-CM

## 2013-02-06 DIAGNOSIS — R5381 Other malaise: Secondary | ICD-10-CM

## 2013-02-06 DIAGNOSIS — D447 Neoplasm of uncertain behavior of aortic body and other paraganglia: Secondary | ICD-10-CM

## 2013-02-06 LAB — CBC WITH DIFFERENTIAL/PLATELET
BASO%: 0.8 % (ref 0.0–2.0)
Basophils Absolute: 0 10*3/uL (ref 0.0–0.1)
EOS%: 3.4 % (ref 0.0–7.0)
HGB: 9.6 g/dL — ABNORMAL LOW (ref 11.6–15.9)
MCH: 31.6 pg (ref 25.1–34.0)
NEUT#: 2 10*3/uL (ref 1.5–6.5)
RDW: 13.7 % (ref 11.2–14.5)
lymph#: 2.1 10*3/uL (ref 0.9–3.3)

## 2013-02-06 LAB — COMPREHENSIVE METABOLIC PANEL (CC13)
Albumin: 3.1 g/dL — ABNORMAL LOW (ref 3.5–5.0)
BUN: 17.2 mg/dL (ref 7.0–26.0)
CO2: 17 mEq/L — ABNORMAL LOW (ref 22–29)
Calcium: 8.3 mg/dL — ABNORMAL LOW (ref 8.4–10.4)
Chloride: 117 mEq/L — ABNORMAL HIGH (ref 98–109)
Creatinine: 1.3 mg/dL — ABNORMAL HIGH (ref 0.6–1.1)
Potassium: 4 mEq/L (ref 3.5–5.1)

## 2013-02-06 LAB — FERRITIN CHCC: Ferritin: 177 ng/ml (ref 9–269)

## 2013-02-06 NOTE — Progress Notes (Addendum)
OFFICE PROGRESS NOTE  Interval history:  [name] returns as scheduled. She notes progressive fatigue. She denies any bleeding. No nausea or vomiting. She reports a good appetite. She is taking ferrous sulfate 325 mg daily. Weight is stable.   Objective: Blood pressure 101/62, pulse 57, temperature 98.1 F (36.7 C), temperature source Oral, resp. rate 19, height 5\' 6"  (1.676 m), weight 171 lb 3.2 oz (77.656 kg).  No thrush or ulcerations. Lungs clear. Regular cardiac rhythm. Abdomen is soft and nontender. No organomegaly. No leg edema.  Lab Results: Lab Results  Component Value Date   WBC 4.6 01/15/2013   HGB 9.6* 01/15/2013   HCT 28.8* 01/15/2013   MCV 93.8 01/15/2013   PLT 183 01/15/2013    Chemistry:    Chemistry      Component Value Date/Time   NA 141 10/01/2011 1009   K 3.9 10/01/2011 1009   CL 112 10/01/2011 1009   CO2 19 10/01/2011 1009   BUN 16 10/01/2011 1009   CREATININE 1.11* 10/01/2011 1009   CREATININE 1.34* 06/11/2011 0900      Component Value Date/Time   CALCIUM 8.8 10/01/2011 1009   ALKPHOS 235* 10/01/2011 1009   AST 20 10/01/2011 1009   ALT 17 10/01/2011 1009   BILITOT 0.4 10/01/2011 1009       Studies/Results: No results found.  Medications: I have reviewed the patient's current medications.  Assessment/Plan:  1. 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 Restaging CT scans and a bone scan at [facility] in March 2011 revealed no evidence for disease progression. The serum metanephrine, catecholamine and Chromogranin A levels remained elevated on 10/22/2010. The chromogranin A level was normal on 07/01/2011 and the metanephrines were elevated. The chromogranin A level was normal on 06/26/2012 and serum catecholamine levels improved on 07/04/2012. Urine metanephrines low, normetanephrine and total metanephrines within normal range on a 24-hour urine 01/26/2013. 2. Intermittent sharp pain at the right face and neck. Potentially related  to nerve injury from the surgery/radiation of the skull base versus the skull base mass. The pain has improved with Neurontin. 3. History of pancytopenia secondary to MIBG therapy. 4. Chronic sinus drainage and headache status post sinus surgery by Dr. Patterson Fisher at [facility] in July 2009. This was followed by nasal endoscopy and debridement in September 2009. She underwent a repeat debridement on 10/22/2009. 5. History of hypertension secondary to the paraganglioma. 6. Status post Cesarean section delivery October 2007. 7. History of oral candidiasis. 8. Osteopenia on a bone density scan 10/13/2010 with a low estradiol level.  9. Status post gastric bypass surgery and cholecystectomy 06/15/2011. 10. Anemia-question related to the gastric bypass surgery with an element of malnutrition. The differential diagnosis includes myelodysplasia and less likely metastatic paraganglioma involving the bone marrow.  Disposition-she is feeling more fatigued. The hemoglobin was lower on 01/15/2013. She will return to the lab today for a CBC, chemistry panel, iron studies, B12 and RBC folate. If no explanation for the anemia is found on the above labs, Dr. Truett Fisher recommends referring to interventional radiology for a diagnostic bone marrow biopsy. She will return for a followup visit in the next 4-5 weeks. She will contact the office in the interim with any problems.  Patient seen with Dr. Truett Fisher.  [name] ANP/GNP-BC   This was a shared visit with [name]. [name] has persistent anemia. The anemia is most likely related to nutritional deficiency following the gastric bypass surgery. The differential diagnosis includes myelodysplasia and  metastatic paraganglioma involving the bone marrow. I discussed the differential diagnosis with [name] today.  We will obtain additional laboratory evaluation. If this is nondiagnostic the plan is to proceed with a bone marrow biopsy in interventional radiology.  [name], M.D.

## 2013-02-06 NOTE — Telephone Encounter (Signed)
gv andprinted appt sched and avs for pt for SEpt...sent pt back to the lab °

## 2013-02-07 LAB — VITAMIN B12: Vitamin B-12: >2000 pg/mL — ABNORMAL HIGH (ref 211–911)

## 2013-02-07 LAB — FOLATE RBC: RBC Folate: 540 ng/mL (ref >=366)

## 2013-02-08 ENCOUNTER — Telehealth: Payer: Self-pay | Admitting: *Deleted

## 2013-02-08 NOTE — Telephone Encounter (Signed)
Pt returned call. Informed her Dr. Truett Perna recommends proceeding with bone marrow biopsy. She understands to expect call from schedulers for BMBX within 2 weeks.

## 2013-02-08 NOTE — Telephone Encounter (Signed)
Attempted to call pt at numbers listed above. No answer at either number. Voicemail unable to accept messages.

## 2013-02-08 NOTE — Telephone Encounter (Signed)
Message copied by Caleb Popp on Thu Feb 08, 2013 11:44 AM ------      Message from: Thornton Papas B      Created: Wed Feb 07, 2013 10:01 PM       Please call patient, no explanation for the anemia on the labs 9/2 . Schedule bmbx in interventional radiology next 2weeks ------

## 2013-02-09 ENCOUNTER — Other Ambulatory Visit: Payer: Self-pay | Admitting: Oncology

## 2013-02-09 DIAGNOSIS — D649 Anemia, unspecified: Secondary | ICD-10-CM

## 2013-02-13 ENCOUNTER — Other Ambulatory Visit: Payer: Self-pay | Admitting: Radiology

## 2013-02-14 ENCOUNTER — Encounter (HOSPITAL_COMMUNITY): Payer: Self-pay

## 2013-02-14 ENCOUNTER — Ambulatory Visit (HOSPITAL_COMMUNITY)
Admission: RE | Admit: 2013-02-14 | Discharge: 2013-02-14 | Disposition: A | Discharge status: Home / Self Care | Payer: Medicare Other | Source: Ambulatory Visit | Attending: Oncology | Admitting: Oncology

## 2013-02-14 DIAGNOSIS — I10 Essential (primary) hypertension: Secondary | ICD-10-CM | POA: Insufficient documentation

## 2013-02-14 DIAGNOSIS — D649 Anemia, unspecified: Secondary | ICD-10-CM | POA: Insufficient documentation

## 2013-02-14 DIAGNOSIS — D447 Neoplasm of uncertain behavior of aortic body and other paraganglia: Secondary | ICD-10-CM | POA: Insufficient documentation

## 2013-02-14 LAB — APTT: aPTT: 25 seconds (ref 24–37)

## 2013-02-14 LAB — CBC
Hemoglobin: 11.1 g/dL — ABNORMAL LOW (ref 12.0–15.0)
MCH: 31.4 pg (ref 26.0–34.0)
Platelets: 181 10*3/uL (ref 150–400)
RBC: 3.53 MIL/uL — ABNORMAL LOW (ref 3.87–5.11)
WBC: 4.7 10*3/uL (ref 4.0–10.5)

## 2013-02-14 LAB — BONE MARROW EXAM

## 2013-02-14 LAB — PROTIME-INR: Prothrombin Time: 14.4 seconds (ref 11.6–15.2)

## 2013-02-14 MED ORDER — MIDAZOLAM HCL 2 MG/2ML IJ SOLN
INTRAMUSCULAR | Status: AC | PRN
  Administered 2013-02-14: 0.5 mg via INTRAVENOUS

## 2013-02-14 MED ORDER — FENTANYL CITRATE 0.05 MG/ML IJ SOLN
INTRAMUSCULAR | Status: AC
  Filled 2013-02-14: qty 6

## 2013-02-14 MED ORDER — MIDAZOLAM HCL 2 MG/2ML IJ SOLN
INTRAMUSCULAR | Status: AC
  Filled 2013-02-14: qty 6

## 2013-02-14 MED ORDER — FENTANYL CITRATE 0.05 MG/ML IJ SOLN
INTRAMUSCULAR | Status: AC | PRN
  Administered 2013-02-14: 100 ug via INTRAVENOUS

## 2013-02-14 MED ORDER — MIDAZOLAM HCL 5 MG/5ML IJ SOLN
INTRAMUSCULAR | Status: AC | PRN
  Administered 2013-02-14: 1 mg via INTRAVENOUS

## 2013-02-14 MED ORDER — HYDROCODONE-ACETAMINOPHEN 5-325 MG PO TABS
1.0000 | ORAL_TABLET | ORAL | Status: DC | PRN
  Filled 2013-02-14: qty 2

## 2013-02-14 MED ORDER — SODIUM CHLORIDE 0.9 % IV SOLN
INTRAVENOUS | Status: DC
  Administered 2013-02-14: 10:00:00 via INTRAVENOUS

## 2013-02-14 NOTE — Procedures (Signed)
CT guided bone marrow aspirates and core biopsy.  No immediate complication.   

## 2013-02-14 NOTE — H&P (Signed)
[name] is an 42 y.o. female.   Chief Complaint: "I'm having a bone marrow biopsy" HPI: Patient with history of metastatic paraganglioma and persistent anemia of unknown origin presents today for CT guided bone marrow biopsy.  Past Medical History  Diagnosis Date  . Hypertension   . Blood transfusion     2003 , 2004   . GERD (gastroesophageal reflux disease)   . Headache(784.0)   . Cancer     pheochromocytoma, paraganglioma  . Elbow injury     10/12 due to fall     Past Surgical History  Procedure Laterality Date  . Adrenalectomy  2004    Pheochromocytoma  . Cesarean section    . Brain surgery  2004     Recurrent Pheochromocytoma, 2006- chemo amd rad tx   . Other surgical history      removed scar tissue from uterus 2000  . Gastric roux-en-y  06/15/2011    Procedure: LAPAROSCOPIC ROUX-EN-Y GASTRIC;  Surgeon: Mariella Saa, MD;  Location: [facility] ORS;  Service: General;  Laterality: N/A;  upper endoscopy  . Cholecystectomy  06/15/2011    Procedure: LAPAROSCOPIC CHOLECYSTECTOMY WITH INTRAOPERATIVE CHOLANGIOGRAM;  Surgeon: Mariella Saa, MD;  Location: [facility];  Service: General;  Laterality: N/A;  . Tubal ligation  03/24/2006    Family History  Problem Relation Age of Onset  . Cancer Mother     breast  . Cancer Maternal Aunt     breast   Social History:  reports that she has never smoked. She has never used smokeless tobacco. She reports that  drinks alcohol. She reports that she does not use illicit drugs.  Allergies:  Allergies  Allergen Reactions  . Enalapril Swelling and Rash    Current outpatient prescriptions:Calcium Citrate-Vitamin D (CITRACAL + D PO), Take 1 tablet by mouth daily. , Disp: , Rfl: ;  cyanocobalamin 500 MCG tablet, Take 2,500 mcg by mouth daily. , Disp: , Rfl: ;  ferrous sulfate 324 (65 FE) MG TBEC, Take 1 tablet by mouth daily., Disp: , Rfl: ;  gabapentin (NEURONTIN) 100 MG capsule, TAKE 1 CAPSULE TWICE A DAY, Disp: 60 capsule, Rfl:  6 JUNEL 1/20 1-20 MG-MCG tablet, TAKE 1 TABLET BY MOUTH EVERY DAY, Disp: 21 tablet, Rfl: 11;  labetalol (NORMODYNE) 200 MG tablet, Take 200 mg by mouth 2 (two) times daily. , Disp: , Rfl: ;  Multiple Vitamin (MULTIVITAMIN) tablet, Take 1 tablet by mouth daily. 2 chewable multivitamins daily, Disp: , Rfl: ;  naproxen sodium (ANAPROX) 220 MG tablet, Take 220 mg by mouth as needed., Disp: , Rfl:  omeprazole (PRILOSEC) 40 MG capsule, Take 40 mg by mouth daily. , Disp: , Rfl: ;  cyclobenzaprine (FLEXERIL) 5 MG tablet, TAKE 1 TABLET 3 TIMES A DAY AS NEEDED FOR MUSCLE SPASMS, Disp: 30 tablet, Rfl: 3 Current facility-administered medications:0.9 %  sodium chloride infusion, , Intravenous, Continuous, D Jeananne Rama, PA-C, Last Rate: 20 mL/hr at 02/14/13 0943   Results for orders placed during the hospital encounter of 02/14/13 (from the past 48 hour(s))  CBC     Status: Abnormal   Collection Time    02/14/13  9:40 AM      Result Value Range   WBC 4.7  4.0 - 10.5 K/uL   RBC 3.53 (*) 3.87 - 5.11 MIL/uL   Hemoglobin 11.1 (*) 12.0 - 15.0 g/dL   HCT 16.1 (*) 09.6 - 04.5 %   MCV 92.6  78.0 - 100.0 fL   MCH 31.4  26.0 - 34.0 pg   MCHC 33.9  30.0 - 36.0 g/dL   RDW 16.1  09.6 - 04.5 %   Platelets 181  150 - 400 K/uL   No results found.  Review of Systems  Constitutional: Positive for malaise/fatigue. Negative for fever and chills.  Respiratory: Negative for cough and shortness of breath.   Cardiovascular: Negative for chest pain.  Gastrointestinal: Negative for nausea, vomiting and abdominal pain.  Musculoskeletal: Negative for back pain.  Neurological: Negative for headaches.  Endo/Heme/Allergies: Does not bruise/bleed easily.    Blood pressure 131/69, pulse 55, temperature 98.4 F (36.9 C), temperature source Oral, resp. rate 16, height 5\' 6"  (1.676 m), weight 171 lb (77.565 kg), last menstrual period 02/12/2013, SpO2 100.00%. Physical Exam  Constitutional: She is oriented to person, place, and  time. She appears well-developed and well-nourished.  Cardiovascular:  Bradycardic but reg rhythm  Respiratory: Effort normal and breath sounds normal.  GI: Soft. Bowel sounds are normal. There is no tenderness.  Musculoskeletal: Normal range of motion. She exhibits no edema.  Neurological: She is alert and oriented to person, place, and time.     Assessment/Plan Pt with hx of metastatic paraganglioma and persistent anemia of unknown origin. Plan is for CT guided bone marrow biopsy today. Details/risks of procedure d/w pt/husband with their understanding and consent.  ALLRED,D KEVIN 02/14/2013, 10:01 AM

## 2013-02-19 ENCOUNTER — Telehealth: Payer: Self-pay | Admitting: *Deleted

## 2013-02-19 NOTE — Telephone Encounter (Signed)
Called pt with BMBX results. Shows no cancer, per Dr. Truett Perna. Pt voiced understanding.

## 2013-02-19 NOTE — Telephone Encounter (Signed)
Message copied by Caleb Popp on Mon Feb 19, 2013  8:56 AM ------      Message from: Ladene Artist      Created: Fri Feb 16, 2013  4:35 PM       Please call patient, bone marrow shows no cancer ------

## 2013-02-22 LAB — CHROMOSOME ANALYSIS, BONE MARROW

## 2013-03-13 ENCOUNTER — Telehealth: Payer: Self-pay | Admitting: Oncology

## 2013-03-13 ENCOUNTER — Ambulatory Visit (HOSPITAL_BASED_OUTPATIENT_CLINIC_OR_DEPARTMENT_OTHER): Payer: Medicare Other | Admitting: Oncology

## 2013-03-13 ENCOUNTER — Other Ambulatory Visit (HOSPITAL_BASED_OUTPATIENT_CLINIC_OR_DEPARTMENT_OTHER): Payer: Medicare Other | Admitting: Lab

## 2013-03-13 VITALS — BP 125/84 | HR 51 | Temp 99.1°F | Resp 18 | Ht 66.0 in | Wt 166.8 lb

## 2013-03-13 DIAGNOSIS — Z86018 Personal history of other benign neoplasm: Secondary | ICD-10-CM

## 2013-03-13 DIAGNOSIS — D649 Anemia, unspecified: Secondary | ICD-10-CM

## 2013-03-13 DIAGNOSIS — Z9884 Bariatric surgery status: Secondary | ICD-10-CM

## 2013-03-13 DIAGNOSIS — M899 Disorder of bone, unspecified: Secondary | ICD-10-CM

## 2013-03-13 LAB — CBC WITH DIFFERENTIAL/PLATELET
BASO%: 0.9 % (ref 0.0–2.0)
Basophils Absolute: 0 10*3/uL (ref 0.0–0.1)
EOS%: 3.9 % (ref 0.0–7.0)
MCH: 31.3 pg (ref 25.1–34.0)
MCHC: 33 g/dL (ref 31.5–36.0)
MCV: 94.7 fL (ref 79.5–101.0)
MONO%: 6.5 % (ref 0.0–14.0)
RBC: 3.27 10*6/uL — ABNORMAL LOW (ref 3.70–5.45)
RDW: 13.1 % (ref 11.2–14.5)
lymph#: 1.7 10*3/uL (ref 0.9–3.3)

## 2013-03-13 NOTE — Telephone Encounter (Signed)
rs pt for lab and appt w [facility] on [id number] printed cal and avs given to pt shh

## 2013-03-13 NOTE — Progress Notes (Signed)
   [facility]    OFFICE PROGRESS NOTE   INTERVAL HISTORY:   [name] returns as scheduled. She underwent a diagnostic bone marrow biopsy on 02/14/2013. This revealed a slightly hypocellular marrow with abundant iron stores. No significant dyspoiesis. No evidence of a lymphoproliferative disorder. The cytogenetics returned with a 46XX karyotype.  She reports malaise. No other complaint.   Objective:  Vital signs in last 24 hours:  Blood pressure 125/84, pulse 51, temperature 99.1 F (37.3 C), temperature source Oral, resp. rate 18, height 5\' 6"  (1.676 m), weight 166 lb 12.8 oz (75.66 kg), last menstrual period 02/12/2013.    HEENT: Neck without mass Lymphatics: No cervical, supraclavicular, or axillary nodes Resp: Lungs clear bilaterally Cardio: Regular rate and rhythm GI: No hepatosplenomegaly, nontender, no mass Vascular: No leg edema   Lab Results:  Lab Results  Component Value Date   WBC 3.6* 03/13/2013   HGB 10.2* 03/13/2013   HCT 30.9* 03/13/2013   MCV 94.7 03/13/2013   PLT 168 03/13/2013   ANC 1.5   Medications: I have reviewed the patient's current medications.  Assessment/Plan: 1. 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 Restaging CT scans and a bone scan at [facility] in March 2011 revealed no evidence for disease progression. The serum metanephrine, catecholamine and Chromogranin A levels remained elevated on 10/22/2010. The chromogranin A level was normal on 07/01/2011 and the metanephrines were elevated. The chromogranin A level was normal on 07/04/2012 and serum catecholamine levels improved on 07/04/2012. Urine metanephrines low, normetanephrine and total metanephrines within normal range on a 24-hour urine 01/26/2013. 2. Intermittent sharp pain at the right face and neck. Potentially related to nerve injury from the surgery/radiation of the skull base versus the skull base mass. The pain has improved with  Neurontin. 3. History of pancytopenia secondary to MIBG therapy. 4. Chronic sinus drainage and headache status post sinus surgery by Dr. Patterson Hammersmith at [facility] in July 2009. This was followed by nasal endoscopy and debridement in September 2009. She underwent a repeat debridement on 10/22/2009. 5. History of hypertension secondary to the paraganglioma. 6. Status post Cesarean section delivery October 2007. 7. History of oral candidiasis. 8. Osteopenia on a bone density scan 10/13/2010 with a low estradiol level.  9. Status post gastric bypass surgery and cholecystectomy 06/15/2011.  Anemia-question related to the gastric bypass surgery with an element of malnutrition versus myelodysplasia. Diagnostic bone marrow biopsy 02/14/2013 with a slightly hypocellular marrow with a 46XX karyotype, abundant iron stores  Disposition:  She appears stable. The anemia is most likely secondary to the gastric bypass surgery versus treatment related myelodysplasia. The hemoglobin has been stable for the past several months. [name] will contact us for new symptoms. She will return for an office visit and CBC in 3 months.   Thornton Papas, MD  03/13/2013  5:41 PM

## 2013-04-12 ENCOUNTER — Other Ambulatory Visit: Payer: Self-pay

## 2013-05-18 ENCOUNTER — Other Ambulatory Visit: Payer: Self-pay | Admitting: Oncology

## 2013-05-31 ENCOUNTER — Other Ambulatory Visit: Payer: Self-pay | Admitting: Oncology

## 2013-05-31 DIAGNOSIS — Z862 Personal history of diseases of the blood and blood-forming organs and certain disorders involving the immune mechanism: Secondary | ICD-10-CM

## 2013-05-31 DIAGNOSIS — M542 Cervicalgia: Secondary | ICD-10-CM

## 2013-06-05 ENCOUNTER — Encounter (HOSPITAL_COMMUNITY): Payer: Self-pay

## 2013-06-13 ENCOUNTER — Ambulatory Visit (HOSPITAL_BASED_OUTPATIENT_CLINIC_OR_DEPARTMENT_OTHER): Payer: Medicare Other | Admitting: Nurse Practitioner

## 2013-06-13 ENCOUNTER — Other Ambulatory Visit (HOSPITAL_BASED_OUTPATIENT_CLINIC_OR_DEPARTMENT_OTHER): Payer: Medicare Other

## 2013-06-13 ENCOUNTER — Telehealth: Payer: Self-pay | Admitting: Oncology

## 2013-06-13 VITALS — BP 116/81 | HR 56 | Temp 97.3°F | Resp 19 | Ht 66.0 in | Wt 167.8 lb

## 2013-06-13 DIAGNOSIS — C768 Malignant neoplasm of other specified ill-defined sites: Secondary | ICD-10-CM

## 2013-06-13 DIAGNOSIS — R51 Headache: Secondary | ICD-10-CM

## 2013-06-13 DIAGNOSIS — M899 Disorder of bone, unspecified: Secondary | ICD-10-CM

## 2013-06-13 DIAGNOSIS — D649 Anemia, unspecified: Secondary | ICD-10-CM

## 2013-06-13 DIAGNOSIS — J329 Chronic sinusitis, unspecified: Secondary | ICD-10-CM

## 2013-06-13 DIAGNOSIS — M542 Cervicalgia: Secondary | ICD-10-CM

## 2013-06-13 DIAGNOSIS — M949 Disorder of cartilage, unspecified: Secondary | ICD-10-CM

## 2013-06-13 DIAGNOSIS — Z86018 Personal history of other benign neoplasm: Secondary | ICD-10-CM

## 2013-06-13 LAB — CBC WITH DIFFERENTIAL/PLATELET
BASO%: 0.7 % (ref 0.0–2.0)
BASOS ABS: 0 10*3/uL (ref 0.0–0.1)
EOS%: 4.1 % (ref 0.0–7.0)
Eosinophils Absolute: 0.2 10*3/uL (ref 0.0–0.5)
HCT: 30.3 % — ABNORMAL LOW (ref 34.8–46.6)
HEMOGLOBIN: 10.1 g/dL — AB (ref 11.6–15.9)
LYMPH%: 46.3 % (ref 14.0–49.7)
MCH: 32.2 pg (ref 25.1–34.0)
MCHC: 33.4 g/dL (ref 31.5–36.0)
MCV: 96.4 fL (ref 79.5–101.0)
MONO#: 0.3 10*3/uL (ref 0.1–0.9)
MONO%: 7.9 % (ref 0.0–14.0)
NEUT#: 1.7 10*3/uL (ref 1.5–6.5)
NEUT%: 41 % (ref 38.4–76.8)
Platelets: 169 10*3/uL (ref 145–400)
RBC: 3.14 10*6/uL — ABNORMAL LOW (ref 3.70–5.45)
RDW: 14.1 % (ref 11.2–14.5)
WBC: 4.1 10*3/uL (ref 3.9–10.3)
lymph#: 1.9 10*3/uL (ref 0.9–3.3)

## 2013-06-13 NOTE — Telephone Encounter (Signed)
appts made per 1/7 POF AVS and CAL given shh

## 2013-06-13 NOTE — Progress Notes (Signed)
OFFICE PROGRESS NOTE  Interval history:  [name] returns as scheduled. She continues to have a poor energy level. She feels "cold". She denies bleeding. She has a good appetite. Her weight is stable. She continues to have intermittent right neck pain. She continues Neurontin and takes Flexeril as needed.   Objective: Filed Vitals:   06/13/13 0902  BP: 116/81  Pulse: 56  Temp: 97.3 F (36.3 C)  Resp: 19   No thrush or ulceration. No palpable cervical, supraclavicular or axillary lymph nodes. Lungs are clear. Regular cardiac rhythm. Abdomen soft and nontender. No organomegaly. No leg edema.   Lab Results: Lab Results  Component Value Date   WBC 4.1 06/13/2013   HGB 10.1* 06/13/2013   HCT 30.3* 06/13/2013   MCV 96.4 06/13/2013   PLT 169 06/13/2013   NEUTROABS 1.7 06/13/2013    Chemistry:    Chemistry      Component Value Date/Time   NA 139 02/06/2013 1243   NA 141 10/01/2011 1009   K 4.0 02/06/2013 1243   K 3.9 10/01/2011 1009   CL 112 10/01/2011 1009   CO2 17* 02/06/2013 1243   CO2 19 10/01/2011 1009   BUN 17.2 02/06/2013 1243   BUN 16 10/01/2011 1009   CREATININE 1.3* 02/06/2013 1243   CREATININE 1.11* 10/01/2011 1009   CREATININE 1.34* 06/11/2011 0900      Component Value Date/Time   CALCIUM 8.3* 02/06/2013 1243   CALCIUM 8.8 10/01/2011 1009   ALKPHOS 126 02/06/2013 1243   ALKPHOS 235* 10/01/2011 1009   AST 32 02/06/2013 1243   AST 20 10/01/2011 1009   ALT 24 02/06/2013 1243   ALT 17 10/01/2011 1009   BILITOT 0.37 02/06/2013 1243   BILITOT 0.4 10/01/2011 1009       Studies/Results: No results found.  Medications: I have reviewed the patient's current medications.  Assessment/Plan: 1. 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 Restaging CT scans and a bone scan at [facility] in March 2011 revealed no evidence for disease progression. The serum metanephrine, catecholamine and Chromogranin A levels remained elevated on 10/22/2010. The chromogranin A level was  normal on 07/01/2011 and the metanephrines were elevated. The chromogranin A level was normal on 07/04/2012 and serum catecholamine levels improved on 07/04/2012. Urine metanephrines low, normetanephrine and total metanephrines within normal range on a 24-hour urine 01/26/2013. 2. Intermittent sharp pain at the right face and neck. Potentially related to nerve injury from the surgery/radiation of the skull base versus the skull base mass. The pain has improved with Neurontin. 3. History of pancytopenia secondary to MIBG therapy. 4. Chronic sinus drainage and headache status post sinus surgery by Dr. Vinetta Bergamo at [facility] in July 2009. This was followed by nasal endoscopy and debridement in September 2009. She underwent a repeat debridement on 10/22/2009. 5. History of hypertension secondary to the paraganglioma. 6. Status post Cesarean section delivery October 2007. 7. History of oral candidiasis. 8. Osteopenia on a bone density scan 10/13/2010 with a low estradiol level.  9. Status post gastric bypass surgery and cholecystectomy 06/15/2011. 10. Anemia-question related to the gastric bypass surgery with an element of malnutrition versus myelodysplasia. Diagnostic bone marrow biopsy 02/14/2013 with a slightly hypocellular marrow with a 46XX karyotype, abundant iron stores. Hemoglobin remains stable.  Dispositon-she appears stable. She will return for labs in approximately 4 months to include a CBC, chromogranin A, catecholamines, metanephrines and 24-hour urine metanephrine. We will schedule a followup visit about 2 weeks after the labs  to review the results with her. She will contact the office in the interim with any problems.  Plan reviewed with Dr. Benay Spice.   [name] ANP/GNP-BC

## 2013-06-18 LAB — CATECHOLAMINES, FRACTIONATED, PLASMA
Catecholamines, Total: 1479 pg/mL
Norepinephrine: 1479 pg/mL

## 2013-08-21 ENCOUNTER — Other Ambulatory Visit: Payer: Self-pay

## 2013-08-21 DIAGNOSIS — Z1231 Encounter for screening mammogram for malignant neoplasm of breast: Secondary | ICD-10-CM

## 2013-09-20 ENCOUNTER — Other Ambulatory Visit: Payer: Self-pay | Admitting: Obstetrics & Gynecology

## 2013-09-21 NOTE — Telephone Encounter (Signed)
APPOINTMENT SCHEDULED FOR 10/04/2013

## 2013-09-27 ENCOUNTER — Other Ambulatory Visit (HOSPITAL_BASED_OUTPATIENT_CLINIC_OR_DEPARTMENT_OTHER): Payer: Medicare Other

## 2013-09-27 DIAGNOSIS — C768 Malignant neoplasm of other specified ill-defined sites: Secondary | ICD-10-CM

## 2013-09-27 DIAGNOSIS — Z86018 Personal history of other benign neoplasm: Secondary | ICD-10-CM

## 2013-09-27 LAB — CBC WITH DIFFERENTIAL/PLATELET
BASO%: 1.1 % (ref 0.0–2.0)
BASOS ABS: 0 10*3/uL (ref 0.0–0.1)
EOS ABS: 0.2 10*3/uL (ref 0.0–0.5)
EOS%: 4 % (ref 0.0–7.0)
HCT: 33.6 % — ABNORMAL LOW (ref 34.8–46.6)
HEMOGLOBIN: 10.8 g/dL — AB (ref 11.6–15.9)
LYMPH%: 53.8 % — AB (ref 14.0–49.7)
MCH: 30.7 pg (ref 25.1–34.0)
MCHC: 32.1 g/dL (ref 31.5–36.0)
MCV: 95.6 fL (ref 79.5–101.0)
MONO#: 0.3 10*3/uL (ref 0.1–0.9)
MONO%: 7.8 % (ref 0.0–14.0)
NEUT#: 1.3 10*3/uL — ABNORMAL LOW (ref 1.5–6.5)
NEUT%: 33.3 % — ABNORMAL LOW (ref 38.4–76.8)
Platelets: 197 10*3/uL (ref 145–400)
RBC: 3.51 10*6/uL — AB (ref 3.70–5.45)
RDW: 13.5 % (ref 11.2–14.5)
WBC: 3.9 10*3/uL (ref 3.9–10.3)
lymph#: 2.1 10*3/uL (ref 0.9–3.3)

## 2013-09-30 LAB — METANEPHRINES, URINE, 24 HOUR
Metaneph Total, Ur: 424 mcg/24 h (ref 182–739)
Metanephrines, Ur: 36 mcg/24 h — ABNORMAL LOW (ref 58–203)
Normetanephrine, 24H Ur: 388 mcg/24 h (ref 88–649)
Volume, Urine-METAN: 1500 mL

## 2013-10-01 LAB — METANEPHRINES, PLASMA
Metanephrine, Free: <25 pg/mL (ref <=57)
Normetanephrine, Free: 258 pg/mL — ABNORMAL HIGH (ref <=148)
Total Metanephrines-Plasma: 258 pg/mL — ABNORMAL HIGH (ref <=205)

## 2013-10-04 ENCOUNTER — Ambulatory Visit: Payer: Medicare Other | Admitting: Obstetrics & Gynecology

## 2013-10-04 LAB — CATECHOLAMINES, FRACTIONATED, PLASMA
Catecholamines, Total: 1026 pg/mL
NOREPINEPHRINE: 1026 pg/mL

## 2013-10-04 LAB — CHROMOGRANIN A: Chromogranin A: 8.4 ng/mL (ref 1.9–15.0)

## 2013-10-05 ENCOUNTER — Ambulatory Visit
Admission: RE | Admit: 2013-10-05 | Discharge: 2013-10-05 | Disposition: A | Discharge status: Home / Self Care | Payer: Medicare Other | Source: Ambulatory Visit

## 2013-10-05 ENCOUNTER — Encounter: Payer: Self-pay | Admitting: Obstetrics & Gynecology

## 2013-10-05 ENCOUNTER — Ambulatory Visit (INDEPENDENT_AMBULATORY_CARE_PROVIDER_SITE_OTHER): Payer: Medicare Other | Admitting: Obstetrics & Gynecology

## 2013-10-05 VITALS — BP 132/84 | HR 65 | Temp 98.8°F | Ht 66.0 in | Wt 171.0 lb

## 2013-10-05 DIAGNOSIS — B9689 Other specified bacterial agents as the cause of diseases classified elsewhere: Secondary | ICD-10-CM

## 2013-10-05 DIAGNOSIS — N760 Acute vaginitis: Secondary | ICD-10-CM

## 2013-10-05 DIAGNOSIS — Z1231 Encounter for screening mammogram for malignant neoplasm of breast: Secondary | ICD-10-CM

## 2013-10-05 DIAGNOSIS — A499 Bacterial infection, unspecified: Secondary | ICD-10-CM

## 2013-10-05 DIAGNOSIS — Z0000 Encounter for general adult medical examination without abnormal findings: Secondary | ICD-10-CM

## 2013-10-05 LAB — POCT WET PREP (WET MOUNT): Clue Cells Wet Prep Whiff POC: POSITIVE

## 2013-10-05 MED ORDER — METRONIDAZOLE 500 MG PO TABS: 500.0000 mg | ORAL_TABLET | Freq: Two times a day (BID) | ORAL | Status: DC

## 2013-10-05 NOTE — Progress Notes (Signed)
Subjective:     [name] is a 43 y.o. female here for a routine exam.     Gynecologic History Patient's last menstrual period was 09/24/2013. Contraception: tubal ligation Last Pap: 2 yrs ago. Results were: normal Last mammogram: today.   Obstetric History OB History  Gravida Para Term Preterm AB SAB TAB Ectopic Multiple Living  5 3 3  2 1 1   3     # Outcome Date GA Lbr Len/2nd Weight Sex Delivery Anes PTL Lv  5 TRM 03/24/06 [id number]  3.147 kg (6 lb 15 oz) F LTCS EPI  Y  4 SAB 2002        N  3 TRM 12/25/91 [redacted]w[redacted]d  3.685 kg (8 lb 2 oz) F SVD None  Y  2 TRM 10/07/89 [redacted]w[redacted]d  3.629 kg (8 lb) M SVD None  Y  1 TAB         N      Past Medical History  Diagnosis Date  . Hypertension   . Blood transfusion     2003 , 2004   . GERD (gastroesophageal reflux disease)   . Headache(784.0)   . Cancer     pheochromocytoma, paraganglioma  . Elbow injury     10/12 due to fall     Past Surgical History  Procedure Laterality Date  . Adrenalectomy  2004    Pheochromocytoma  . Cesarean section    . Brain surgery  2004     Recurrent Pheochromocytoma, 2006- chemo amd rad tx   . Other surgical history      removed scar tissue from uterus 2000  . Gastric roux-en-y  06/15/2011    Procedure: LAPAROSCOPIC ROUX-EN-Y GASTRIC;  Surgeon: Edward Jolly, MD;  Location: [facility];  Service: General;  Laterality: N/A;  upper endoscopy  . Cholecystectomy  06/15/2011    Procedure: LAPAROSCOPIC CHOLECYSTECTOMY WITH INTRAOPERATIVE CHOLANGIOGRAM;  Surgeon: Edward Jolly, MD;  Location: [facility];  Service: General;  Laterality: N/A;  . Tubal ligation  03/24/2006    Current outpatient prescriptions:Calcium Citrate-Vitamin D (CITRACAL + D PO), Take 1 tablet by mouth daily. , Disp: , Rfl: ;  cyanocobalamin 500 MCG tablet, Take 2,500 mcg by mouth daily. , Disp: , Rfl: ;  cyclobenzaprine (FLEXERIL) 5 MG tablet, TAKE 1 TABLET 3 TIMES A DAY AS NEEDED FOR MUSCLE SPASMS, Disp: 30 tablet, Rfl: 0;  ferrous sulfate  324 (65 FE) MG TBEC, Take 1 tablet by mouth daily., Disp: , Rfl:  gabapentin (NEURONTIN) 100 MG capsule, TAKE 1 CAPSULE TWICE A DAY, Disp: 60 capsule, Rfl: 6;  JUNEL 1/20 1-20 MG-MCG tablet, TAKE 1 TABLET BY MOUTH EVERY DAY, Disp: 21 tablet, Rfl: 1;  labetalol (NORMODYNE) 200 MG tablet, Take 200 mg by mouth 2 (two) times daily. , Disp: , Rfl: ;  Multiple Vitamin (MULTIVITAMIN) tablet, Take 1 tablet by mouth daily. 2 chewable multivitamins daily, Disp: , Rfl:  omeprazole (PRILOSEC) 40 MG capsule, Take 40 mg by mouth daily. , Disp: , Rfl: ;  clobetasol ointment (TEMOVATE) 0.05 %, , Disp: , Rfl: ;  metroNIDAZOLE (FLAGYL) 500 MG tablet, Take 1 tablet (500 mg total) by mouth 2 (two) times daily. For 7 days, Disp: 14 tablet, Rfl: 0 Allergies  Allergen Reactions  . Enalapril Swelling and Rash    History  Substance Use Topics  . Smoking status: Never Smoker   . Smokeless tobacco: Never Used  . Alcohol Use: Yes     Comment: occasional  Family History  Problem Relation Age of Onset  . Cancer Mother     breast  . Cancer Maternal Aunt     breast      Review of Systems  Constitutional: negative for fatigue and weight loss Respiratory: negative for cough and wheezing Cardiovascular: negative for chest pain, fatigue and palpitations Gastrointestinal: negative for abdominal pain and change in bowel habits Musculoskeletal:negative for myalgias Neurological: negative for gait problems and tremors Behavioral/Psych: negative for abusive relationship, depression Endocrine: negative for temperature intolerance   Genitourinary:negative for abnormal menstrual periods, genital lesions, hot flashes, sexual problems and positive for vaginal discharge Integument/breast: negative for breast lump, breast tenderness, nipple discharge and skin lesion(s)    Objective:        General:   alert  Skin:   no rash or abnormalities  Lungs:   clear to auscultation bilaterally  Heart:   regular rate and rhythm,  S1, S2 normal, no murmur, click, rub or gallop  Breasts:   normal without suspicious masses, skin or nipple changes or axillary nodes  Abdomen:  normal findings: no organomegaly, soft, non-tender and no hernia  Pelvis:  External genitalia: normal general appearance Urinary system: urethral meatus normal and bladder without fullness, nontender Vaginal: normal without tenderness, induration or masses; thin white vaginal discharge Cervix: normal appearance Adnexa: normal bimanual exam Uterus: anteverted and non-tender, normal size    Lab Review  Labs reviewed no Radiologic studies reviewed no     Assessment:    Healthy female exam.   Bacterial vaginosis Plan:    Education reviewed: calcium supplements and weight bearing exercise.  Meds ordered this encounter  Medications  . clobetasol ointment (TEMOVATE) 0.05 %    Sig:   . metroNIDAZOLE (FLAGYL) 500 MG tablet    Sig: Take 1 tablet (500 mg total) by mouth 2 (two) times daily. For 7 days    Dispense:  14 tablet    Refill:  0    Orders Placed This Encounter  Procedures  . POCT Wet Prep [facility])    Follow up as needed.

## 2013-10-05 NOTE — Patient Instructions (Signed)
Health Maintenance, Female A healthy lifestyle and preventative care can promote health and wellness.  Maintain regular health, dental, and eye exams.  Eat a healthy diet. Foods like vegetables, fruits, whole grains, low-fat dairy products, and lean protein foods contain the nutrients you need without too many calories. Decrease your intake of foods high in solid fats, added sugars, and salt. Get information about a proper diet from your caregiver, if necessary.  Regular physical exercise is one of the most important things you can do for your health. Most adults should get at least 150 minutes of moderate-intensity exercise (any activity that increases your heart rate and causes you to sweat) each week. In addition, most adults need muscle-strengthening exercises on 2 or more days a week.   Maintain a healthy weight. The body mass index (BMI) is a screening tool to identify possible weight problems. It provides an estimate of body fat based on height and weight. Your caregiver can help determine your BMI, and can help you achieve or maintain a healthy weight. For adults 20 years and older:  A BMI below 18.5 is considered underweight.  A BMI of 18.5 to 24.9 is normal.  A BMI of 25 to 29.9 is considered overweight.  A BMI of 30 and above is considered obese.  Maintain normal blood lipids and cholesterol by exercising and minimizing your intake of saturated fat. Eat a balanced diet with plenty of fruits and vegetables. Blood tests for lipids and cholesterol should begin at age 20 and be repeated every 5 years. If your lipid or cholesterol levels are high, you are over 50, or you are a high risk for heart disease, you may need your cholesterol levels checked more frequently.Ongoing high lipid and cholesterol levels should be treated with medicines if diet and exercise are not effective.  If you smoke, find out from your caregiver how to quit. If you do not use tobacco, do not start.  Lung  cancer screening is recommended for adults aged 55 80 years who are at high risk for developing lung cancer because of a history of smoking. Yearly low-dose computed tomography (CT) is recommended for people who have at least a 30-pack-year history of smoking and are a current smoker or have quit within the past 15 years. A pack year of smoking is smoking an average of 1 pack of cigarettes a day for 1 year (for example: 1 pack a day for 30 years or 2 packs a day for 15 years). Yearly screening should continue until the smoker has stopped smoking for at least 15 years. Yearly screening should also be stopped for people who develop a health problem that would prevent them from having lung cancer treatment.  If you are pregnant, do not drink alcohol. If you are breastfeeding, be very cautious about drinking alcohol. If you are not pregnant and choose to drink alcohol, do not exceed 1 drink per day. One drink is considered to be 12 ounces (355 mL) of beer, 5 ounces (148 mL) of wine, or 1.5 ounces (44 mL) of liquor.  Avoid use of street drugs. Do not share needles with anyone. Ask for help if you need support or instructions about stopping the use of drugs.  High blood pressure causes heart disease and increases the risk of stroke. Blood pressure should be checked at least every 1 to 2 years. Ongoing high blood pressure should be treated with medicines, if weight loss and exercise are not effective.  If you are 55 to   42 years old, ask your caregiver if you should take aspirin to prevent strokes.  Diabetes screening involves taking a blood sample to check your fasting blood sugar level. This should be done once every 3 years, after age 45, if you are within normal weight and without risk factors for diabetes. Testing should be considered at a younger age or be carried out more frequently if you are overweight and have at least 1 risk factor for diabetes.  Breast cancer screening is essential preventative care  for women. You should practice "breast self-awareness." This means understanding the normal appearance and feel of your breasts and may include breast self-examination. Any changes detected, no matter how small, should be reported to a caregiver. Women in their 20s and 30s should have a clinical breast exam (CBE) by a caregiver as part of a regular health exam every 1 to 3 years. After age 40, women should have a CBE every year. Starting at age 40, women should consider having a mammogram (breast X-ray) every year. Women who have a family history of breast cancer should talk to their caregiver about genetic screening. Women at a high risk of breast cancer should talk to their caregiver about having an MRI and a mammogram every year.  Breast cancer gene (BRCA)-related cancer risk assessment is recommended for women who have family members with BRCA-related cancers. BRCA-related cancers include breast, ovarian, tubal, and peritoneal cancers. Having family members with these cancers may be associated with an increased risk for harmful changes (mutations) in the breast cancer genes BRCA1 and BRCA2. Results of the assessment will determine the need for genetic counseling and BRCA1 and BRCA2 testing.  The Pap test is a screening test for cervical cancer. Women should have a Pap test starting at age 21. Between ages 21 and 29, Pap tests should be repeated every 2 years. Beginning at age 30, you should have a Pap test every 3 years as long as the past 3 Pap tests have been normal. If you had a hysterectomy for a problem that was not cancer or a condition that could lead to cancer, then you no longer need Pap tests. If you are between ages 65 and 70, and you have had normal Pap tests going back 10 years, you no longer need Pap tests. If you have had past treatment for cervical cancer or a condition that could lead to cancer, you need Pap tests and screening for cancer for at least 20 years after your treatment. If Pap  tests have been discontinued, risk factors (such as a new sexual partner) need to be reassessed to determine if screening should be resumed. Some women have medical problems that increase the chance of getting cervical cancer. In these cases, your caregiver may recommend more frequent screening and Pap tests.  The human papillomavirus (HPV) test is an additional test that may be used for cervical cancer screening. The HPV test looks for the virus that can cause the cell changes on the cervix. The cells collected during the Pap test can be tested for HPV. The HPV test could be used to screen women aged 30 years and older, and should be used in women of any age who have unclear Pap test results. After the age of 30, women should have HPV testing at the same frequency as a Pap test.  Colorectal cancer can be detected and often prevented. Most routine colorectal cancer screening begins at the age of 50 and continues through age 75. However, your caregiver   may recommend screening at an earlier age if you have risk factors for colon cancer. On a yearly basis, your caregiver may provide home test kits to check for hidden blood in the stool. Use of a small camera at the end of a tube, to directly examine the colon (sigmoidoscopy or colonoscopy), can detect the earliest forms of colorectal cancer. Talk to your caregiver about this at age 37, when routine screening begins. Direct examination of the colon should be repeated every 5 to 10 years through age 63, unless early forms of pre-cancerous polyps or small growths are found.  Hepatitis C blood testing is recommended for all people born from [birth year] through 1964 and any individual with known risks for hepatitis C.  Practice safe sex. Use condoms and avoid high-risk sexual practices to reduce the spread of sexually transmitted infections (STIs). Sexually active women aged 103 and younger should be checked for Chlamydia, which is a common sexually transmitted infection.  Older women with new or multiple partners should also be tested for Chlamydia. Testing for other STIs is recommended if you are sexually active and at increased risk.  Osteoporosis is a disease in which the bones lose minerals and strength with aging. This can result in serious bone fractures. The risk of osteoporosis can be identified using a bone density scan. Women ages 38 and over and women at risk for fractures or osteoporosis should discuss screening with their caregivers. Ask your caregiver whether you should be taking a calcium supplement or vitamin D to reduce the rate of osteoporosis.  Menopause can be associated with physical symptoms and risks. Hormone replacement therapy is available to decrease symptoms and risks. You should talk to your caregiver about whether hormone replacement therapy is right for you.  Use sunscreen. Apply sunscreen liberally and repeatedly throughout the day. You should seek shade when your shadow is shorter than you. Protect yourself by wearing long sleeves, pants, a wide-brimmed hat, and sunglasses year round, whenever you are outdoors.  Notify your caregiver of new moles or changes in moles, especially if there is a change in shape or color. Also notify your caregiver if a mole is larger than the size of a pencil eraser.  Stay current with your immunizations. Document Released: 12/07/2010 Document Revised: 09/18/2012 Document Reviewed: 12/07/2010 [facility] Patient Information 2014 [place]. Bacterial Vaginosis Bacterial vaginosis is a vaginal infection that occurs when the normal balance of bacteria in the vagina is disrupted. It results from an overgrowth of certain bacteria. This is the most common vaginal infection in women of childbearing age. Treatment is important to prevent complications, especially in pregnant women, as it can cause a premature delivery. CAUSES  Bacterial vaginosis is caused by an increase in harmful bacteria that are normally  present in smaller amounts in the vagina. Several different kinds of bacteria can cause bacterial vaginosis. However, the reason that the condition develops is not fully understood. RISK FACTORS Certain activities or behaviors can put you at an increased risk of developing bacterial vaginosis, including:  Having a new sex partner or multiple sex partners.  Douching.  Using an intrauterine device (IUD) for contraception. Women do not get bacterial vaginosis from toilet seats, bedding, swimming pools, or contact with objects around them. SIGNS AND SYMPTOMS  Some women with bacterial vaginosis have no signs or symptoms. Common symptoms include:  Grey vaginal discharge.  A fishlike odor with discharge, especially after sexual intercourse.  Itching or burning of the vagina and vulva.  Burning or pain  with urination. DIAGNOSIS  Your health care provider will take a medical history and examine the vagina for signs of bacterial vaginosis. A sample of vaginal fluid may be taken. Your health care provider will look at this sample under a microscope to check for bacteria and abnormal cells. A vaginal pH test may also be done.  TREATMENT  Bacterial vaginosis may be treated with antibiotic medicines. These may be given in the form of a pill or a vaginal cream. A second round of antibiotics may be prescribed if the condition comes back after treatment.  HOME CARE INSTRUCTIONS   Only take over-the-counter or prescription medicines as directed by your health care provider.  If antibiotic medicine was prescribed, take it as directed. Make sure you finish it even if you start to feel better.  Do not have sex until treatment is completed.  Tell all sexual partners that you have a vaginal infection. They should see their health care provider and be treated if they have problems, such as a mild rash or itching.  Practice safe sex by using condoms and only having one sex partner. SEEK MEDICAL CARE IF:     Your symptoms are not improving after 3 days of treatment.  You have increased discharge or pain.  You have a fever. MAKE SURE YOU:   Understand these instructions.  Will watch your condition.  Will get help right away if you are not doing well or get worse. FOR MORE INFORMATION  Centers for Disease Control and Prevention, Division of STD Prevention: [employer] American Sexual Health Association (ASHA): www.ashastd.org  Document Released: 05/24/2005 Document Revised: 03/14/2013 Document Reviewed: 01/03/2013 [facility] Patient Information 2014 [place].

## 2013-10-08 ENCOUNTER — Ambulatory Visit (HOSPITAL_BASED_OUTPATIENT_CLINIC_OR_DEPARTMENT_OTHER): Payer: Medicare Other | Admitting: Oncology

## 2013-10-08 ENCOUNTER — Telehealth: Payer: Self-pay | Admitting: Oncology

## 2013-10-08 VITALS — BP 115/63 | HR 58 | Temp 98.5°F | Resp 18 | Ht 66.0 in | Wt 171.2 lb

## 2013-10-08 DIAGNOSIS — C768 Malignant neoplasm of other specified ill-defined sites: Secondary | ICD-10-CM

## 2013-10-08 DIAGNOSIS — D649 Anemia, unspecified: Secondary | ICD-10-CM

## 2013-10-08 DIAGNOSIS — R51 Headache: Secondary | ICD-10-CM

## 2013-10-08 LAB — PAP IG W/ RFLX HPV ASCU

## 2013-10-08 NOTE — Progress Notes (Signed)
  [place] OFFICE PROGRESS NOTE   Diagnosis: Paraganglioma  INTERVAL HISTORY:   [name] returns as scheduled. She has noted increased headaches recently. Stable right upper neck discomfort. She was taken off of labetalol by Dr. Laurann Montana. Oral iron was decreased to 3 days per week. She denies bleeding.  Objective:  Vital signs in last 24 hours:  Blood pressure 115/63, pulse 58, temperature 98.5 F (36.9 C), temperature source Oral, resp. rate 18, height $RemoveBe'5\' 6"'foDXsUNfI$  (1.676 m), weight 171 lb 3.2 oz (77.656 kg), last menstrual period 09/24/2013.    HEENT: Neck without mass Lymphatics: No cervical, supraclavicular, or axillary nodes Resp: Lungs clear bilaterally Cardio: Regular rate and rhythm GI: No hepatosplenomegaly Vascular: No leg edema   Lab Results:  Lab Results  Component Value Date   WBC 3.9 09/27/2013   HGB 10.8* 09/27/2013   HCT 33.6* 09/27/2013   MCV 95.6 09/27/2013   PLT 197 09/27/2013   NEUTROABS 1.3* 09/27/2013   chromogranin A 8.4, norepinephrine 1026, free metanephrines less and 25, free normetanephrine 258 (less than 148), Total your metanephrines 424 (441 on 01/26/2013, 578 on 12/27/2011)   Medications: I have reviewed the patient's current medications.  Assessment/Plan: 1. 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 Restaging CT scans and a bone scan at [facility] in March 2011 revealed no evidence for disease progression. The serum metanephrine, catecholamine and Chromogranin A levels remained elevated on 10/22/2010.  2. Intermittent sharp pain at the right face and neck. Potentially related to nerve injury from the surgery/radiation of the skull base versus the skull base mass. The pain has improved with Neurontin. 3. History of pancytopenia secondary to MIBG therapy. 4. Chronic sinus drainage and headache status post sinus surgery by Dr. Vinetta Bergamo at [facility] in July 2009. This was followed by nasal endoscopy and  debridement in September 2009. She underwent a repeat debridement on 10/22/2009. 5. History of hypertension secondary to the paraganglioma, improved 6. Status post Cesarean section delivery October 2007. 7. History of oral candidiasis. 8. Osteopenia on a bone density scan 10/13/2010 with a low estradiol level.  9. Status post gastric bypass surgery and cholecystectomy 06/15/2011. 10. Anemia-question related to the gastric bypass surgery with an element of malnutrition versus myelodysplasia. Diagnostic bone marrow biopsy 02/14/2013 with a slightly hypocellular marrow with a 46XX karyotype, abundant iron stores. Hemoglobin remains stable.  Disposition:  There is no clinical or laboratory evidence for progression of the metastatic paraganglioma. She will contact us for increased headaches and we will consider imaging the skull base. She will return for an office and lab visit in 4 months. The anemia is most likely related to the gastric bypass surgery and radioactive MIBG therapy.  Ladell Pier, MD  10/08/2013  2:06 PM

## 2013-10-08 NOTE — Telephone Encounter (Signed)
Gave pt appt for lab and MD , emailed michelle regarding chemo for friday °

## 2013-11-22 ENCOUNTER — Other Ambulatory Visit: Payer: Self-pay | Admitting: Obstetrics & Gynecology

## 2013-11-26 ENCOUNTER — Other Ambulatory Visit: Payer: Self-pay | Admitting: *Deleted

## 2013-11-26 DIAGNOSIS — Z3041 Encounter for surveillance of contraceptive pills: Secondary | ICD-10-CM

## 2013-11-26 MED ORDER — NORETHINDRONE ACET-ETHINYL EST 1-20 MG-MCG PO TABS: 1.0000 | ORAL_TABLET | Freq: Every day | ORAL | Status: DC

## 2013-11-26 NOTE — Telephone Encounter (Signed)
Patient last annual exam was 10/2013. Prescription sent electronically to pharmacy. LM on patients voicemail letting patient know that the prescription she requested had been sent.

## 2014-02-04 ENCOUNTER — Ambulatory Visit (HOSPITAL_BASED_OUTPATIENT_CLINIC_OR_DEPARTMENT_OTHER): Payer: Medicare Other

## 2014-02-04 DIAGNOSIS — Z86018 Personal history of other benign neoplasm: Secondary | ICD-10-CM

## 2014-02-04 DIAGNOSIS — C768 Malignant neoplasm of other specified ill-defined sites: Secondary | ICD-10-CM

## 2014-02-04 DIAGNOSIS — D649 Anemia, unspecified: Secondary | ICD-10-CM

## 2014-02-04 LAB — CBC WITH DIFFERENTIAL/PLATELET
BASO%: 0.5 % (ref 0.0–2.0)
Basophils Absolute: 0 10*3/uL (ref 0.0–0.1)
EOS%: 3.7 % (ref 0.0–7.0)
Eosinophils Absolute: 0.1 10*3/uL (ref 0.0–0.5)
HCT: 32.2 % — ABNORMAL LOW (ref 34.8–46.6)
HGB: 10.6 g/dL — ABNORMAL LOW (ref 11.6–15.9)
LYMPH%: 52.8 % — AB (ref 14.0–49.7)
MCH: 30.7 pg (ref 25.1–34.0)
MCHC: 32.9 g/dL (ref 31.5–36.0)
MCV: 93.3 fL (ref 79.5–101.0)
MONO#: 0.4 10*3/uL (ref 0.1–0.9)
MONO%: 10 % (ref 0.0–14.0)
NEUT%: 33 % — ABNORMAL LOW (ref 38.4–76.8)
NEUTROS ABS: 1.3 10*3/uL — AB (ref 1.5–6.5)
Platelets: 197 10*3/uL (ref 145–400)
RBC: 3.45 10*6/uL — AB (ref 3.70–5.45)
RDW: 13.7 % (ref 11.2–14.5)
WBC: 3.8 10*3/uL — ABNORMAL LOW (ref 3.9–10.3)
lymph#: 2 10*3/uL (ref 0.9–3.3)

## 2014-02-04 LAB — FERRITIN CHCC: FERRITIN: 197 ng/mL (ref 9–269)

## 2014-02-08 ENCOUNTER — Telehealth: Payer: Self-pay | Admitting: Oncology

## 2014-02-08 ENCOUNTER — Ambulatory Visit (HOSPITAL_BASED_OUTPATIENT_CLINIC_OR_DEPARTMENT_OTHER): Payer: Medicare Other | Admitting: Nurse Practitioner

## 2014-02-08 VITALS — BP 122/73 | HR 59 | Temp 98.9°F | Resp 18 | Ht 66.0 in | Wt 173.6 lb

## 2014-02-08 DIAGNOSIS — C768 Malignant neoplasm of other specified ill-defined sites: Secondary | ICD-10-CM

## 2014-02-08 DIAGNOSIS — Z86018 Personal history of other benign neoplasm: Secondary | ICD-10-CM

## 2014-02-08 DIAGNOSIS — D649 Anemia, unspecified: Secondary | ICD-10-CM

## 2014-02-08 DIAGNOSIS — M899 Disorder of bone, unspecified: Secondary | ICD-10-CM

## 2014-02-08 DIAGNOSIS — H9202 Otalgia, left ear: Secondary | ICD-10-CM

## 2014-02-08 DIAGNOSIS — M949 Disorder of cartilage, unspecified: Secondary | ICD-10-CM

## 2014-02-08 MED ORDER — AMOXICILLIN 500 MG PO CAPS: 500.0000 mg | ORAL_CAPSULE | Freq: Three times a day (TID) | ORAL | Status: DC

## 2014-02-08 NOTE — Progress Notes (Signed)
  [name] OFFICE PROGRESS NOTE   Diagnosis:  Paraganglioma.  INTERVAL HISTORY:   [name] returns as scheduled. She has a good appetite. Energy level is stable. She denies bleeding. The headaches she was extrinsic at the time of her last visit 4 months ago have improved. No significant right neck pain. Over the past 1-1/2 weeks she has had left ear pain and left neck pain. She takes Tylenol and naproxen with partial relief. No recent upper respiratory infection. She denies fever. No shortness of breath   Objective:  Vital signs in last 24 hours:  Blood pressure 122/73, pulse 59, temperature 98.9 F (37.2 C), temperature source Oral, resp. rate 18, height $RemoveBe'5\' 6"'MSfMZiHFb$  (1.676 m), weight 173 lb 9.6 oz (78.744 kg), SpO2 100.00%.    HEENT: No thrush or ulcers. Left tympanic membrane is erythematous. Lymphatics: No palpable cervical, supraclavicular or axillary lymph nodes. Resp: Lungs clear bilaterally. Cardio: Regular rate and rhythm. GI: Abdomen soft and nontender. No hepatomegaly. Vascular: No leg edema.   Lab Results:  Lab Results  Component Value Date   WBC 3.8* 02/04/2014   HGB 10.6* 02/04/2014   HCT 32.2* 02/04/2014   MCV 93.3 02/04/2014   PLT 197 02/04/2014   NEUTROABS 1.3* 02/04/2014    Imaging:  No results found.  Medications: I have reviewed the patient's current medications.  Assessment/Plan: 1. 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 Restaging CT scans and a bone scan at [facility] in March 2011 revealed no evidence for disease progression. The serum metanephrine, catecholamine and Chromogranin A levels remained elevated on 10/22/2010.  2. Intermittent sharp pain at the right face and neck. Potentially related to nerve injury from the surgery/radiation of the skull base versus the skull base mass. The pain has improved with Neurontin. 3. History of pancytopenia secondary to MIBG therapy. 4. Chronic sinus drainage and  headache status post sinus surgery by Dr. Vinetta Fisher at [facility] in July 2009. This was followed by nasal endoscopy and debridement in September 2009. She underwent a repeat debridement on 10/22/2009. 5. History of hypertension secondary to the paraganglioma, improved 6. Status post Cesarean section delivery October 2007. 7. History of oral candidiasis. 8. Osteopenia on a bone density scan 10/13/2010 with a low estradiol level.  9. Status post gastric bypass surgery and cholecystectomy 06/15/2011. 10. Anemia-question related to the gastric bypass surgery with an element of malnutrition versus myelodysplasia. Diagnostic bone marrow biopsy 02/14/2013 with a slightly hypocellular marrow with a 46XX karyotype, abundant iron stores. Hemoglobin remains stable. 11. Left ear and neck pain with erythematous tympanic membrane.   Disposition: [name] appears stable. We will obtain metanephrine, catecholamine and chromogranin A levels just prior to her next visit in 4 months.  The left ear and neck pain may be due to acute otitis media. She will complete a course of amoxicillin. If symptoms do not resolve following the course of antibiotics we will make a referral to ENT.  She will return for a followup visit in 4 months with the above labs one to 2 weeks prior. She will contact the office in the interim with any problems. We specifically discussed persistent ear and neck pain.    [name] ANP/GNP-BC   02/08/2014  10:00 AM

## 2014-02-08 NOTE — Telephone Encounter (Signed)
gv and printed appt sched and avs for pt fro Jan 2016 °

## 2014-02-25 ENCOUNTER — Other Ambulatory Visit: Payer: Self-pay | Admitting: Nurse Practitioner

## 2014-02-25 ENCOUNTER — Telehealth: Payer: Self-pay | Admitting: *Deleted

## 2014-02-25 ENCOUNTER — Telehealth: Payer: Self-pay | Admitting: Nurse Practitioner

## 2014-02-25 DIAGNOSIS — D447 Neoplasm of uncertain behavior of aortic body and other paraganglia: Secondary | ICD-10-CM

## 2014-02-25 NOTE — Telephone Encounter (Signed)
Pt called reports "I saw [name] last time and she said to call if my ear pain doesn't stop after antibiotic"  Pt states she finished her Amoxicillin a week and a half ago and her ears are still hurting.  "Mainly my left ear more than my right"  Taking Tylenol for pain; request to have scan done rather that referral to ENT "that's how they found my brain cancer before"  Note to Dr. Benay Spice.

## 2014-02-25 NOTE — Telephone Encounter (Signed)
, °

## 2014-02-26 ENCOUNTER — Other Ambulatory Visit: Payer: Self-pay | Admitting: *Deleted

## 2014-02-26 MED ORDER — DIAZEPAM 5 MG PO TABS: 5.0000 mg | ORAL_TABLET | Freq: Once | ORAL | Status: DC

## 2014-02-26 NOTE — Telephone Encounter (Signed)
Per Dr. Benay Spice; notified pt that request for Valium tablet prior to MRI scan Oct. 1 has been called in to her pharmacy.  Pt verbalized understanding and expressed appreciation for call back.

## 2014-03-07 ENCOUNTER — Ambulatory Visit (HOSPITAL_COMMUNITY)
Admission: RE | Admit: 2014-03-07 | Discharge: 2014-03-07 | Disposition: A | Discharge status: Home / Self Care | Payer: Medicare Other | Source: Ambulatory Visit | Attending: Nurse Practitioner | Admitting: Nurse Practitioner

## 2014-03-07 ENCOUNTER — Other Ambulatory Visit: Payer: Self-pay | Admitting: Nurse Practitioner

## 2014-03-07 DIAGNOSIS — D447 Neoplasm of uncertain behavior of aortic body and other paraganglia: Secondary | ICD-10-CM

## 2014-03-07 DIAGNOSIS — Z923 Personal history of irradiation: Secondary | ICD-10-CM | POA: Diagnosis not present

## 2014-03-07 MED ORDER — GADOBENATE DIMEGLUMINE 529 MG/ML IV SOLN
20.0000 mL | Freq: Once | INTRAVENOUS | Status: AC | PRN
  Administered 2014-03-07: 16 mL via INTRAVENOUS

## 2014-03-08 ENCOUNTER — Telehealth: Payer: Self-pay | Admitting: Oncology

## 2014-03-08 ENCOUNTER — Other Ambulatory Visit: Payer: Self-pay | Admitting: Nurse Practitioner

## 2014-03-08 NOTE — Telephone Encounter (Signed)
s/w pt re appt for 10/5.

## 2014-03-11 ENCOUNTER — Telehealth: Payer: Self-pay | Admitting: Oncology

## 2014-03-11 ENCOUNTER — Ambulatory Visit (HOSPITAL_BASED_OUTPATIENT_CLINIC_OR_DEPARTMENT_OTHER): Payer: Medicare Other | Admitting: Nurse Practitioner

## 2014-03-11 VITALS — BP 131/70 | HR 51 | Temp 97.6°F | Resp 19 | Ht 66.0 in | Wt 175.0 lb

## 2014-03-11 DIAGNOSIS — D447 Neoplasm of uncertain behavior of aortic body and other paraganglia: Secondary | ICD-10-CM

## 2014-03-11 DIAGNOSIS — H9202 Otalgia, left ear: Secondary | ICD-10-CM

## 2014-03-11 DIAGNOSIS — C755 Malignant neoplasm of aortic body and other paraganglia: Secondary | ICD-10-CM

## 2014-03-11 DIAGNOSIS — M542 Cervicalgia: Secondary | ICD-10-CM

## 2014-03-11 MED ORDER — TRAMADOL HCL 50 MG PO TABS: 50.0000 mg | ORAL_TABLET | Freq: Four times a day (QID) | ORAL | Status: DC | PRN

## 2014-03-11 NOTE — Telephone Encounter (Signed)
Pt has appointment with Dr. Wilburn Cornelia on 10.12 @10 :50am....pt aware

## 2014-03-11 NOTE — Progress Notes (Addendum)
[place] OFFICE PROGRESS NOTE   Diagnosis:  Paraganglioma  INTERVAL HISTORY:   [name] returns prior to scheduled followup. At the time of her last visit on 02/08/2014 she reported a 1-1/2 week history of left ear and neck pain. On exam the left tympanic membrane was erythematous. She was treated with a course of amoxicillin. She contacted the office on 02/25/2014 to report persistent left ear pain. She was referred for an MRI of the brain 03/07/2014 which showed residual enhancing tumor at the skull base without significant change compared to 2009.  She continues to have mainly left-sided ear pain which at times radiates to the jaw and neck. She intermittently notes right ear pain as well. She takes Tylenol with partial relief. She woke up this morning with "sinus symptoms". She feels congested. No fever or shortness of breath.  Objective:  Vital signs in last 24 hours:  Blood pressure 131/70, pulse 51, temperature 97.6 F (36.4 C), temperature source Oral, resp. rate 19, height _0  (1.676 m), weight 175 lb (79.379 kg).    HEENT: No thrush or ulcers. Tympanic membranes unremarkable, no erythema. Lymphatics: No palpable cervical lymph nodes. Resp: Lungs clear bilaterally. Cardio: Regular rate and rhythm. GI: Abdomen soft and nontender. No hepatomegaly. Vascular: No leg edema.  Lab Results:  Lab Results  Component Value Date   WBC 3.8* 02/04/2014   HGB 10.6* 02/04/2014   HCT 32.2* 02/04/2014   MCV 93.3 02/04/2014   PLT 197 02/04/2014   NEUTROABS 1.3* 02/04/2014    Imaging:  No results found.  Medications: I have reviewed the patient's current medications.  Assessment/Plan: 1. 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 Restaging CT scans and a bone scan at [facility] in March 2011 revealed no evidence for disease progression. The serum metanephrine, catecholamine and Chromogranin A levels remained elevated on 10/22/2010.    2. Intermittent sharp pain at the right face and neck. Potentially related to nerve injury from the surgery/radiation of the skull base versus the skull base mass. The pain has improved with Neurontin. 3. History of pancytopenia secondary to MIBG therapy. 4. Chronic sinus drainage and headache status post sinus surgery by Dr. Vinetta Bergamo at [facility] in July 2009. This was followed by nasal endoscopy and debridement in September 2009. She underwent a repeat debridement on 10/22/2009. 5. History of hypertension secondary to the paraganglioma, improved 6. Status post Cesarean section delivery October 2007. 7. History of oral candidiasis. 8. Osteopenia on a bone density scan 10/13/2010 with a low estradiol level.  9. Status post gastric bypass surgery and cholecystectomy 06/15/2011. 10. Anemia-question related to the gastric bypass surgery with an element of malnutrition versus myelodysplasia. Diagnostic bone marrow biopsy 02/14/2013 with a slightly hypocellular marrow with a 46XX karyotype, abundant iron stores. Hemoglobin remains stable. 11. Left ear and neck pain 02/08/2014. She completed a course of amoxicillin with no improvement. Brain MRI 03/07/2014 showed stable residual enhancing tumor at the skull base compared to 2009.   Disposition: [name] continues to have left ear and neck pain. The recent MRI of the brain showed no significant change in the skull base mass when compared to the most recent scan done in 2009. Dr. Benay Spice recommends a referral to ENT.  For the pain she was given a prescription for tramadol 50 mg every 6 hours as needed. She will contact the office if this is not effective.  We scheduled a return visit in approximately one month. She will contact  the office in the interim as outlined above or with any other problems.  Patient seen with Dr. Benay Spice.   [name] ANP/GNP-BC   03/11/2014  3:01 PM  This was a shared visit with [name]. The MRI shows no evidence  of tumor progression. I suspect her pain is related to sinus disease.  We will refer her to ENT. She has undergone repeat sinus surgery in the past.  Julieanne Manson, MD

## 2014-03-22 ENCOUNTER — Other Ambulatory Visit: Payer: Self-pay

## 2014-04-08 ENCOUNTER — Encounter: Payer: Self-pay | Admitting: Obstetrics & Gynecology

## 2014-04-08 ENCOUNTER — Telehealth: Payer: Self-pay | Admitting: *Deleted

## 2014-04-08 NOTE — Telephone Encounter (Signed)
Called to cancel her follow up with Dr. Benay Spice for 04/15/14. Has not seen the ENT yet and has not heard about an appointment. Called office and scheduled her for appointment with Dr. Izora Gala on 04/09/14 at 0930/arrive at 0910. Location [address]. She agrees to make this appointment. Will call to reschedule her visit here. Spoke with Jokeved with Dr. Janeice Robinson office and made appt. For [employer]. They had her down in October as a NO SHOW. Told them our office may have gave the patient the appointment information, so the ftka was our fault. Faxed records to [phone].

## 2014-04-15 ENCOUNTER — Ambulatory Visit: Payer: Medicare Other | Admitting: Oncology

## 2014-06-03 ENCOUNTER — Encounter: Payer: Self-pay | Admitting: *Deleted

## 2014-06-04 ENCOUNTER — Encounter: Payer: Self-pay | Admitting: Obstetrics & Gynecology

## 2014-06-07 LAB — METANEPHRINES, URINE, 24 HOUR
METANEPH TOTAL UR: 491 ug/(24.h) (ref 182–739)
Metanephrines, Ur: 42 mcg/24 h — ABNORMAL LOW (ref 58–203)
NORMETANEPHRINE 24H UR: 449 ug/(24.h) (ref 88–649)
VOLUME, URINE-METAN: 1500 mL

## 2014-06-10 ENCOUNTER — Other Ambulatory Visit (HOSPITAL_BASED_OUTPATIENT_CLINIC_OR_DEPARTMENT_OTHER): Payer: Medicare Other

## 2014-06-10 DIAGNOSIS — Z86018 Personal history of other benign neoplasm: Secondary | ICD-10-CM

## 2014-06-10 DIAGNOSIS — Z8589 Personal history of malignant neoplasm of other organs and systems: Secondary | ICD-10-CM

## 2014-06-10 LAB — CBC WITH DIFFERENTIAL/PLATELET
BASO%: 1 % (ref 0.0–2.0)
Basophils Absolute: 0 10*3/uL (ref 0.0–0.1)
EOS%: 5.5 % (ref 0.0–7.0)
Eosinophils Absolute: 0.2 10*3/uL (ref 0.0–0.5)
HCT: 34.4 % — ABNORMAL LOW (ref 34.8–46.6)
HGB: 11.3 g/dL — ABNORMAL LOW (ref 11.6–15.9)
LYMPH%: 53 % — AB (ref 14.0–49.7)
MCH: 31 pg (ref 25.1–34.0)
MCHC: 32.8 g/dL (ref 31.5–36.0)
MCV: 94.2 fL (ref 79.5–101.0)
MONO#: 0.3 10*3/uL (ref 0.1–0.9)
MONO%: 8.5 % (ref 0.0–14.0)
NEUT#: 1.3 10*3/uL — ABNORMAL LOW (ref 1.5–6.5)
NEUT%: 32 % — ABNORMAL LOW (ref 38.4–76.8)
PLATELETS: 192 10*3/uL (ref 145–400)
RBC: 3.65 10*6/uL — AB (ref 3.70–5.45)
RDW: 14 % (ref 11.2–14.5)
WBC: 4 10*3/uL (ref 3.9–10.3)
lymph#: 2.1 10*3/uL (ref 0.9–3.3)

## 2014-06-12 ENCOUNTER — Other Ambulatory Visit: Payer: Self-pay | Admitting: Oncology

## 2014-06-15 LAB — METANEPHRINES, PLASMA
Metanephrine, Free: <25 pg/mL (ref <=57)
Normetanephrine, Free: 108 pg/mL (ref <=148)
TOTAL METANEPHRINES-PLASMA: 108 pg/mL (ref <=205)

## 2014-06-15 LAB — CATECHOLAMINES, FRACTIONATED, PLASMA
CATECHOLAMINES, TOTAL: 605 pg/mL
Norepinephrine: 605 pg/mL

## 2014-06-15 LAB — CHROMOGRANIN A: CHROMOGRANIN A: 14 ng/mL (ref <=15)

## 2014-06-24 ENCOUNTER — Telehealth: Payer: Self-pay | Admitting: Oncology

## 2014-06-24 ENCOUNTER — Ambulatory Visit: Payer: Medicare Other | Admitting: Oncology

## 2014-06-24 NOTE — Telephone Encounter (Signed)
Per MD NP/LT out sick r/s pt in 2 wks with LT. S/w pt confirmed apt w/NP .Marland Kitchen... KJ

## 2014-07-08 ENCOUNTER — Ambulatory Visit (HOSPITAL_BASED_OUTPATIENT_CLINIC_OR_DEPARTMENT_OTHER): Payer: Medicare Other | Admitting: Nurse Practitioner

## 2014-07-08 ENCOUNTER — Telehealth: Payer: Self-pay | Admitting: Oncology

## 2014-07-08 VITALS — BP 118/92 | HR 66 | Temp 99.3°F | Resp 18 | Ht 66.0 in | Wt 177.6 lb

## 2014-07-08 DIAGNOSIS — M542 Cervicalgia: Secondary | ICD-10-CM

## 2014-07-08 DIAGNOSIS — H9202 Otalgia, left ear: Secondary | ICD-10-CM

## 2014-07-08 DIAGNOSIS — M8580 Other specified disorders of bone density and structure, unspecified site: Secondary | ICD-10-CM

## 2014-07-08 DIAGNOSIS — Z86018 Personal history of other benign neoplasm: Secondary | ICD-10-CM

## 2014-07-08 DIAGNOSIS — Z8589 Personal history of malignant neoplasm of other organs and systems: Secondary | ICD-10-CM

## 2014-07-08 NOTE — Telephone Encounter (Signed)
gv and printed appt sched and avs for pt for [facility]

## 2014-07-08 NOTE — Progress Notes (Signed)
  [place] OFFICE PROGRESS NOTE   Diagnosis:  Paraganglioma  INTERVAL HISTORY:   [name] returns for scheduled follow-up. She notes improvement in the left ear and neck pain. She periodically takes tramadol which is effective. No change in baseline headaches. She reports a good appetite. She had to cancel her appointment with ENT. She plans to reschedule after tax season.  Objective:  Vital signs in last 24 hours:  Blood pressure 118/92, pulse 66, temperature 99.3 F (37.4 C), temperature source Oral, resp. rate 18, height _0  (1.676 m), weight 177 lb 9.6 oz (80.559 kg), SpO2 100 %.    HEENT: No thrush or ulcers. Lymphatics: No palpable cervical or supra clavicular lymph nodes. Resp: Lungs clear bilaterally. Cardio: Regular rate and rhythm. GI: Abdomen soft and nontender. No hepatomegaly. Vascular: No leg edema.  Lab Results:  Lab Results  Component Value Date   WBC 4.0 06/10/2014   HGB 11.3* 06/10/2014   HCT 34.4* 06/10/2014   MCV 94.2 06/10/2014   PLT 192 06/10/2014   NEUTROABS 1.3* 06/10/2014    Imaging:  No results found.  Medications: I have reviewed the patient's current medications.  Assessment/Plan: 1. 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 Restaging CT scans and a bone scan at [facility] in March 2011 revealed no evidence for disease progression. The serum metanephrine, catecholamine and Chromogranin A levels remained elevated on 10/22/2010. Levels were within normal range 06/10/2014. 2. Intermittent sharp pain at the right face and neck. Potentially related to nerve injury from the surgery/radiation of the skull base versus the skull base mass. The pain has improved with Neurontin. 3. History of pancytopenia secondary to MIBG therapy. 4. Chronic sinus drainage and headache status post sinus surgery by Dr. Vinetta Bergamo at [facility] in July 2009. This was followed by nasal endoscopy and debridement in September  2009. She underwent a repeat debridement on 10/22/2009. 5. History of hypertension secondary to the paraganglioma, improved 6. Status post Cesarean section delivery October 2007. 7. History of oral candidiasis. 8. Osteopenia on a bone density scan 10/13/2010 with a low estradiol level.  9. Status post gastric bypass surgery and cholecystectomy 06/15/2011. 10. Anemia-question related to the gastric bypass surgery with an element of malnutrition versus myelodysplasia. Diagnostic bone marrow biopsy 02/14/2013 with a slightly hypocellular marrow with a 46XX karyotype, abundant iron stores. Hemoglobin remains stable. 11. Left ear and neck pain 02/08/2014. She completed a course of amoxicillin with no improvement. Brain MRI 03/07/2014 showed stable residual enhancing tumor at the skull base compared to 2009. She has been referred to ENT. Pain improved 07/08/2014.   Disposition: [name] appears stable. The left ear and neck pain have improved. She requires Tramadol periodically. She plans to follow up with ENT. She will return for a follow-up visit here in 4 months. She will contact the office in the interim with any problems.  Plan reviewed with Dr. Benay Spice.    [name] ANP/GNP-BC   07/08/2014  10:42 AM

## 2014-09-04 ENCOUNTER — Other Ambulatory Visit: Payer: Self-pay

## 2014-09-04 DIAGNOSIS — Z1231 Encounter for screening mammogram for malignant neoplasm of breast: Secondary | ICD-10-CM

## 2014-09-30 ENCOUNTER — Other Ambulatory Visit: Payer: Self-pay | Admitting: *Deleted

## 2014-09-30 MED ORDER — NORETHINDRONE ACET-ETHINYL EST 1-20 MG-MCG PO TABS: 1.0000 | ORAL_TABLET | Freq: Every day | ORAL | Status: DC

## 2014-10-14 ENCOUNTER — Ambulatory Visit: Payer: Medicare Other

## 2014-10-15 ENCOUNTER — Ambulatory Visit: Payer: Medicare Other

## 2014-10-16 ENCOUNTER — Telehealth: Payer: Self-pay | Admitting: Obstetrics

## 2014-10-21 NOTE — Telephone Encounter (Signed)
[id number] - Patient has not returned calls to schedule AEX for refills. brm

## 2014-10-22 ENCOUNTER — Telehealth: Payer: Self-pay | Admitting: *Deleted

## 2014-10-22 DIAGNOSIS — Z3041 Encounter for surveillance of contraceptive pills: Secondary | ICD-10-CM

## 2014-10-22 MED ORDER — NORETHINDRONE ACET-ETHINYL EST 1-20 MG-MCG PO TABS: 1.0000 | ORAL_TABLET | Freq: Every day | ORAL | Status: DC

## 2014-10-22 NOTE — Telephone Encounter (Signed)
Patient contacted the office requesting a refill on her birth control. Patient has medicare and is not sue an annual exam until 10/2015. Patient advised to check with pharmacy this evening and prescription will be sent to the pharmacy.

## 2014-10-23 ENCOUNTER — Ambulatory Visit
Admission: RE | Admit: 2014-10-23 | Discharge: 2014-10-23 | Disposition: A | Discharge status: Home / Self Care | Payer: Medicare Other | Source: Ambulatory Visit

## 2014-10-23 DIAGNOSIS — Z1231 Encounter for screening mammogram for malignant neoplasm of breast: Secondary | ICD-10-CM

## 2014-11-11 ENCOUNTER — Telehealth: Payer: Self-pay | Admitting: Oncology

## 2014-11-11 ENCOUNTER — Other Ambulatory Visit (HOSPITAL_BASED_OUTPATIENT_CLINIC_OR_DEPARTMENT_OTHER): Payer: Medicare Other

## 2014-11-11 ENCOUNTER — Ambulatory Visit (HOSPITAL_BASED_OUTPATIENT_CLINIC_OR_DEPARTMENT_OTHER): Payer: Medicare Other | Admitting: Oncology

## 2014-11-11 VITALS — BP 106/81 | HR 55 | Temp 99.0°F | Resp 18 | Ht 66.0 in | Wt 180.4 lb

## 2014-11-11 DIAGNOSIS — M8580 Other specified disorders of bone density and structure, unspecified site: Secondary | ICD-10-CM | POA: Diagnosis not present

## 2014-11-11 DIAGNOSIS — Z86018 Personal history of other benign neoplasm: Secondary | ICD-10-CM

## 2014-11-11 DIAGNOSIS — D649 Anemia, unspecified: Secondary | ICD-10-CM | POA: Diagnosis not present

## 2014-11-11 DIAGNOSIS — D447 Neoplasm of uncertain behavior of aortic body and other paraganglia: Secondary | ICD-10-CM

## 2014-11-11 DIAGNOSIS — R6884 Jaw pain: Secondary | ICD-10-CM

## 2014-11-11 DIAGNOSIS — M542 Cervicalgia: Secondary | ICD-10-CM

## 2014-11-11 DIAGNOSIS — C755 Malignant neoplasm of aortic body and other paraganglia: Secondary | ICD-10-CM | POA: Diagnosis not present

## 2014-11-11 LAB — CBC WITH DIFFERENTIAL/PLATELET
BASO%: 1.1 % (ref 0.0–2.0)
Basophils Absolute: 0 10*3/uL (ref 0.0–0.1)
EOS%: 4 % (ref 0.0–7.0)
Eosinophils Absolute: 0.2 10*3/uL (ref 0.0–0.5)
HCT: 34.1 % — ABNORMAL LOW (ref 34.8–46.6)
HEMOGLOBIN: 11.3 g/dL — AB (ref 11.6–15.9)
LYMPH%: 54 % — ABNORMAL HIGH (ref 14.0–49.7)
MCH: 31.4 pg (ref 25.1–34.0)
MCHC: 33.1 g/dL (ref 31.5–36.0)
MCV: 95 fL (ref 79.5–101.0)
MONO#: 0.3 10*3/uL (ref 0.1–0.9)
MONO%: 6.9 % (ref 0.0–14.0)
NEUT#: 1.4 10*3/uL — ABNORMAL LOW (ref 1.5–6.5)
NEUT%: 34 % — ABNORMAL LOW (ref 38.4–76.8)
Platelets: 170 10*3/uL (ref 145–400)
RBC: 3.59 10*6/uL — ABNORMAL LOW (ref 3.70–5.45)
RDW: 13.7 % (ref 11.2–14.5)
WBC: 4.2 10*3/uL (ref 3.9–10.3)
lymph#: 2.3 10*3/uL (ref 0.9–3.3)

## 2014-11-11 LAB — COMPREHENSIVE METABOLIC PANEL (CC13)
ALBUMIN: 3.2 g/dL — AB (ref 3.5–5.0)
ALT: 19 U/L (ref 0–55)
AST: 21 U/L (ref 5–34)
Alkaline Phosphatase: 124 U/L (ref 40–150)
Anion Gap: 6 mEq/L (ref 3–11)
BUN: 16.1 mg/dL (ref 7.0–26.0)
CALCIUM: 8.3 mg/dL — AB (ref 8.4–10.4)
CO2: 19 meq/L — AB (ref 22–29)
CREATININE: 1 mg/dL (ref 0.6–1.1)
Chloride: 113 mEq/L — ABNORMAL HIGH (ref 98–109)
EGFR: 84 mL/min/{1.73_m2} — ABNORMAL LOW (ref >90)
GLUCOSE: 80 mg/dL (ref 70–140)
POTASSIUM: 4.1 meq/L (ref 3.5–5.1)
SODIUM: 139 meq/L (ref 136–145)
TOTAL PROTEIN: 6.7 g/dL (ref 6.4–8.3)
Total Bilirubin: 0.42 mg/dL (ref 0.20–1.20)

## 2014-11-11 NOTE — Progress Notes (Signed)
  [place] OFFICE PROGRESS NOTE   Diagnosis: Paraganglioma  INTERVAL HISTORY:   Ms. Morganti returns as scheduled. She complains of bilateral jaw pain that is present at rest and with chewing. She was evaluated by ENT and diagnosed with "TMJ ". She reports recently seeing a dentist and states the Simona Huh feels she needs her wisdom teeth removed. The jaw pain is relieved with tramadol. The sharp pain in the right neck remains much improved while on gabapentin. She continues to have intermittent headaches.  Objective:  Vital signs in last 24 hours:  Blood pressure 106/81, pulse 55, temperature 99 F (37.2 C), resp. rate 18, height _0  (1.676 m), weight 180 lb 6.4 oz (81.829 kg), SpO2 100 %.    HEENT: Neck without mass, mild tenderness at the bilateral TMJ Lymphatics: No cervical or supraclavicular nodes Resp: Lungs clear bilaterally Cardio: Regular rate and rhythm GI: No hepatosplenomegaly, nontender, no mass Vascular: No leg edema   Lab Results:  Lab Results  Component Value Date   WBC 4.2 11/11/2014   HGB 11.3* 11/11/2014   HCT 34.1* 11/11/2014   MCV 95.0 11/11/2014   PLT 170 11/11/2014   NEUTROABS 1.4* 11/11/2014     Medications: I have reviewed the patient's current medications.  Assessment/Plan: 1. 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 Restaging CT scans and a bone scan at [facility] in March 2011 revealed no evidence for disease progression. The serum metanephrine, catecholamine and Chromogranin A levels remained elevated on 10/22/2010. Levels were within normal range 06/10/2014. 2. Intermittent sharp pain at the right face and neck. Potentially related to nerve injury from the surgery/radiation of the skull base versus the skull base mass. The pain has improved with Neurontin. 3. History of pancytopenia secondary to MIBG therapy. 4. Chronic sinus drainage and headache status post sinus surgery by Dr. Vinetta Bergamo at [facility] in July 2009. This was followed by nasal endoscopy and debridement in September 2009. She underwent a repeat debridement on 10/22/2009. 5. History of hypertension secondary to the paraganglioma, improved 6. Status post Cesarean section delivery October 2007. 7. History of oral candidiasis. 8. Osteopenia on a bone density scan 10/13/2010 with a low estradiol level.  9. Status post gastric bypass surgery and cholecystectomy 06/15/2011. 10. Anemia-question related to the gastric bypass surgery with an element of malnutrition versus myelodysplasia. Diagnostic bone marrow biopsy 02/14/2013 with a slightly hypocellular marrow with a 46XX karyotype, abundant iron stores. Hemoglobin remains stable.        11. Left ear and neck pain 02/08/2014. She completed a course of amoxicillin with no improvement. Brain MRI 03/07/2014 showed stable residual enhancing tumor at the skull base compared to 2009. She was referred to ENT and diagnosed with "TMJ ".  Disposition:  Ms. Fellner appears stable from an oncology standpoint. The serum hormone levels were stable in January. I doubt the jaw pain is related to the base of the skull tumor, but this is possible. She plans to continue follow-up with ENT and dental medicine. Ms. Stepanian will return for an office and lab visit in 4 months.  Betsy Coder, MD  11/11/2014  12:04 PM

## 2014-11-11 NOTE — Telephone Encounter (Signed)
per pof to sch pt appt-gave pt avs °

## 2014-11-27 ENCOUNTER — Other Ambulatory Visit: Payer: Self-pay | Admitting: Nurse Practitioner

## 2014-12-01 ENCOUNTER — Other Ambulatory Visit: Payer: Self-pay | Admitting: Obstetrics

## 2015-01-15 ENCOUNTER — Encounter (HOSPITAL_COMMUNITY): Payer: Self-pay | Admitting: Emergency Medicine

## 2015-01-15 ENCOUNTER — Emergency Department (HOSPITAL_COMMUNITY)
Admission: EM | Admit: 2015-01-15 | Discharge: 2015-01-15 | Disposition: A | Discharge status: Home / Self Care | Payer: Medicare Other | Attending: Emergency Medicine | Admitting: Emergency Medicine

## 2015-01-15 DIAGNOSIS — K219 Gastro-esophageal reflux disease without esophagitis: Secondary | ICD-10-CM | POA: Diagnosis not present

## 2015-01-15 DIAGNOSIS — R51 Headache: Secondary | ICD-10-CM | POA: Insufficient documentation

## 2015-01-15 DIAGNOSIS — I10 Essential (primary) hypertension: Secondary | ICD-10-CM | POA: Diagnosis not present

## 2015-01-15 DIAGNOSIS — R202 Paresthesia of skin: Secondary | ICD-10-CM | POA: Insufficient documentation

## 2015-01-15 DIAGNOSIS — R519 Headache, unspecified: Secondary | ICD-10-CM

## 2015-01-15 DIAGNOSIS — Z79899 Other long term (current) drug therapy: Secondary | ICD-10-CM | POA: Diagnosis not present

## 2015-01-15 DIAGNOSIS — Z859 Personal history of malignant neoplasm, unspecified: Secondary | ICD-10-CM | POA: Diagnosis not present

## 2015-01-15 DIAGNOSIS — Z87828 Personal history of other (healed) physical injury and trauma: Secondary | ICD-10-CM | POA: Insufficient documentation

## 2015-01-15 LAB — CBC WITH DIFFERENTIAL/PLATELET
BASOS ABS: 0 10*3/uL (ref 0.0–0.1)
BASOS PCT: 1 % (ref 0–1)
EOS PCT: 3 % (ref 0–5)
Eosinophils Absolute: 0.2 10*3/uL (ref 0.0–0.7)
HCT: 37.8 % (ref 36.0–46.0)
Hemoglobin: 12.3 g/dL (ref 12.0–15.0)
Lymphocytes Relative: 54 % — ABNORMAL HIGH (ref 12–46)
Lymphs Abs: 2.5 10*3/uL (ref 0.7–4.0)
MCH: 30.9 pg (ref 26.0–34.0)
MCHC: 32.5 g/dL (ref 30.0–36.0)
MCV: 95 fL (ref 78.0–100.0)
MONOS PCT: 8 % (ref 3–12)
Monocytes Absolute: 0.4 10*3/uL (ref 0.1–1.0)
NEUTROS ABS: 1.6 10*3/uL — AB (ref 1.7–7.7)
NEUTROS PCT: 34 % — AB (ref 43–77)
PLATELETS: 221 10*3/uL (ref 150–400)
RBC: 3.98 MIL/uL (ref 3.87–5.11)
RDW: 13.5 % (ref 11.5–15.5)
WBC: 4.6 10*3/uL (ref 4.0–10.5)

## 2015-01-15 LAB — BASIC METABOLIC PANEL
ANION GAP: 7 (ref 5–15)
BUN: 17 mg/dL (ref 6–20)
CALCIUM: 9 mg/dL (ref 8.9–10.3)
CO2: 23 mmol/L (ref 22–32)
Chloride: 108 mmol/L (ref 101–111)
Creatinine, Ser: 1.16 mg/dL — ABNORMAL HIGH (ref 0.44–1.00)
GFR calc Af Amer: >60 mL/min (ref >60)
GFR calc non Af Amer: 57 mL/min — ABNORMAL LOW (ref >60)
Glucose, Bld: 86 mg/dL (ref 65–99)
Potassium: 4.5 mmol/L (ref 3.5–5.1)
Sodium: 138 mmol/L (ref 135–145)

## 2015-01-15 LAB — MAGNESIUM: MAGNESIUM: 2.1 mg/dL (ref 1.7–2.4)

## 2015-01-15 MED ORDER — SODIUM CHLORIDE 0.9 % IV BOLUS (SEPSIS)
1000.0000 mL | Freq: Once | INTRAVENOUS | Status: AC
  Administered 2015-01-15: 1000 mL via INTRAVENOUS

## 2015-01-15 MED ORDER — DIPHENHYDRAMINE HCL 50 MG/ML IJ SOLN
25.0000 mg | Freq: Once | INTRAMUSCULAR | Status: AC
  Administered 2015-01-15: 25 mg via INTRAVENOUS
  Filled 2015-01-15: qty 1

## 2015-01-15 MED ORDER — KETOROLAC TROMETHAMINE 30 MG/ML IJ SOLN
30.0000 mg | Freq: Once | INTRAMUSCULAR | Status: AC
  Administered 2015-01-15: 30 mg via INTRAVENOUS
  Filled 2015-01-15: qty 1

## 2015-01-15 MED ORDER — PROCHLORPERAZINE EDISYLATE 5 MG/ML IJ SOLN
10.0000 mg | Freq: Once | INTRAMUSCULAR | Status: AC
  Administered 2015-01-15: 10 mg via INTRAVENOUS
  Filled 2015-01-15: qty 2

## 2015-01-15 NOTE — Discharge Instructions (Signed)

## 2015-01-15 NOTE — ED Notes (Signed)
Pt states that about 20 mins ago when she had gotten home, her right eye closed shut and she had some tightness with her right side of her mouth.  Pt also adds that she has "funny feeling" in her lower RUE.  Pt also has right sided headache, pt denies n/v, blurred vision.  Pt rates HA 8/10.  Pt has symmetrical facial features.  Pt has equal and moderate grips.  Pt has steady gait to and from lobby to triage room.

## 2015-01-15 NOTE — ED Notes (Signed)
WENT

## 2015-01-15 NOTE — ED Provider Notes (Signed)
CSN: [id number]     Arrival date & time 01/15/15  1606 History   First MD Initiated Contact with Patient 01/15/15 1822     Chief Complaint  Patient presents with  . Headache  . arm tingling      (Consider location/radiation/quality/duration/timing/severity/associated sxs/prior Treatment) Patient is a 44 y.o. female presenting with headaches. The history is provided by the patient.  Headache Pain location:  Frontal Quality:  Stabbing Radiates to:  Does not radiate Severity currently:  5/10 Severity at highest:  5/10 Onset quality:  Gradual Duration:  2 hours Timing:  Constant Progression:  Worsening Chronicity:  New Similar to prior headaches: no   Context: bright light and loud noise   Relieved by:  Nothing Worsened by:  Light Ineffective treatments:  None tried Associated symptoms: no congestion, no dizziness, no fever, no myalgias, no nausea and no vomiting    44 yo F with a chief complaint of right-sided facial spasm as well as decreased sensation to a proximal aspects of her right wrist. Patient states this happened twice lasted about 5 minutes each time. Patient but was worse this evening and then was followed by a right-sided frontal headache. Patient states the headache is worse with bright lights and loud noises. Patient has a history of migraines though denies history of similar symptoms.  Past Medical History  Diagnosis Date  . Hypertension   . Blood transfusion     2003 , 2004   . GERD (gastroesophageal reflux disease)   . Headache(784.0)   . Cancer     pheochromocytoma, paraganglioma  . Elbow injury     10/12 due to fall    Past Surgical History  Procedure Laterality Date  . Adrenalectomy  2004    Pheochromocytoma  . Cesarean section    . Brain surgery  2004     Recurrent Pheochromocytoma, 2006- chemo amd rad tx   . Other surgical history      removed scar tissue from uterus 2000  . Gastric roux-en-y  06/15/2011    Procedure: LAPAROSCOPIC ROUX-EN-Y  GASTRIC;  Surgeon: Edward Jolly, MD;  Location: [facility] ORS;  Service: General;  Laterality: N/A;  upper endoscopy  . Cholecystectomy  06/15/2011    Procedure: LAPAROSCOPIC CHOLECYSTECTOMY WITH INTRAOPERATIVE CHOLANGIOGRAM;  Surgeon: Edward Jolly, MD;  Location: [facility] ORS;  Service: General;  Laterality: N/A;  . Tubal ligation  03/24/2006   Family History  Problem Relation Age of Onset  . Cancer Mother     breast  . Cancer Maternal Aunt     breast   Social History  Substance Use Topics  . Smoking status: Never Smoker   . Smokeless tobacco: Never Used  . Alcohol Use: Yes     Comment: occasional    OB History    Gravida Para Term Preterm AB TAB SAB Ectopic Multiple Living   5 3 3  2 1 1   3      Review of Systems  Constitutional: Negative for fever and chills.  HENT: Negative for congestion and rhinorrhea.   Eyes: Negative for redness and visual disturbance.  Respiratory: Negative for shortness of breath and wheezing.   Cardiovascular: Negative for chest pain and palpitations.  Gastrointestinal: Negative for nausea and vomiting.  Genitourinary: Negative for dysuria and urgency.  Musculoskeletal: Negative for myalgias and arthralgias.  Skin: Negative for pallor and wound.  Neurological: Positive for headaches. Negative for dizziness.      Allergies  Enalapril  Home Medications   Prior to  Admission medications   Medication Sig Start Date End Date Taking? Authorizing Provider  Calcium Citrate-Vitamin D (CITRACAL + D PO) Take 1 tablet by mouth daily.    Yes Historical Provider, MD  cyanocobalamin 500 MCG tablet Take 2,500 mcg by mouth daily. Takes 3 times a week   Yes Historical Provider, MD  cyclobenzaprine (FLEXERIL) 5 MG tablet TAKE 1 TABLET 3 TIMES A DAY AS NEEDED FOR MUSCLE SPASMS 05/31/13  Yes Ladell Pier, MD  ferrous sulfate 324 (65 FE) MG TBEC Take 1 tablet by mouth daily. Take 3 times a week   Yes Historical Provider, MD  gabapentin (NEURONTIN) 100 MG  capsule TAKE 1 CAPSULE TWICE A DAY Patient taking differently: TAKE 1 CAPSULE Daily 06/13/14  Yes Ladell Pier, MD  HYDROcodone-acetaminophen (NORCO/VICODIN) 5-325 MG per tablet Take 1-2 tablets by mouth every 6 (six) hours as needed. 01/03/15  Yes Historical Provider, MD  Multiple Vitamin (MULTIVITAMIN) tablet Take 2 tablets by mouth daily. 2 chewable multivitamins daily   Yes Historical Provider, MD  norethindrone-ethinyl estradiol (JUNEL 1/20) 1-20 MG-MCG tablet Take 1 tablet by mouth daily. 10/22/14  Yes Shelly Bombard, MD  omeprazole (PRILOSEC) 40 MG capsule Take 40 mg by mouth daily.    Yes Historical Provider, MD  traMADol (ULTRAM) 50 MG tablet TAKE 1 TABLET BY MOUTH EVERY 6 HOURS AS NEEDED Patient taking differently: TAKE 1 TABLET BY MOUTH EVERY 6 HOURS AS NEEDED for Pain 11/28/14  Yes Owens Shark, NP   BP 129/106 mmHg  Pulse 59  Temp(Src) 98 F (36.7 C) (Oral)  Resp 15  SpO2 100%  LMP 01/14/2015 Physical Exam  Constitutional: She is oriented to person, place, and time. She appears well-developed and well-nourished. No distress.  HENT:  Head: Normocephalic and atraumatic.  Eyes: EOM are normal. Pupils are equal, round, and reactive to light.  Neck: Normal range of motion. Neck supple.  Cardiovascular: Normal rate and regular rhythm.  Exam reveals no gallop and no friction rub.   No murmur heard. Pulmonary/Chest: Effort normal. She has no wheezes. She has no rales.  Abdominal: Soft. She exhibits no distension. There is no tenderness.  Musculoskeletal: She exhibits no edema or tenderness.  Neurological: She is alert and oriented to person, place, and time. She has normal strength. No cranial nerve deficit or sensory deficit. She displays a negative Romberg sign. Coordination and gait normal. GCS eye subscore is 4. GCS verbal subscore is 5. GCS motor subscore is 6. She displays no Babinski's sign on the right side. She displays no Babinski's sign on the left side.  Reflex Scores:       Tricep reflexes are 2+ on the right side and 2+ on the left side.      Bicep reflexes are 2+ on the right side and 2+ on the left side.      Brachioradialis reflexes are 2+ on the right side and 2+ on the left side.      Patellar reflexes are 2+ on the right side and 2+ on the left side.      Achilles reflexes are 2+ on the right side and 2+ on the left side. Skin: Skin is warm and dry. She is not diaphoretic.  Psychiatric: She has a normal mood and affect. Her behavior is normal.    ED Course  Procedures (including critical care time) Labs Review Labs Reviewed  CBC WITH DIFFERENTIAL/PLATELET - Abnormal; Notable for the following:    Neutrophils Relative % 34 (*)  Neutro Abs 1.6 (*)    Lymphocytes Relative 54 (*)    All other components within normal limits  BASIC METABOLIC PANEL - Abnormal; Notable for the following:    Creatinine, Ser 1.16 (*)    GFR calc non Af Amer 57 (*)    All other components within normal limits  MAGNESIUM    Imaging Review No results found.   EKG Interpretation None      MDM   Final diagnoses:  Frontal headache    44 yo F with a chief complaint of right-sided facial spasm and headache. Patient with benign neuro exam will treat with migraine cocktail. Check electrolytes to evaluate for hypocalcemia. Feel that symptoms are unlikely to be a TIA with no weakness but spasm.  Patient symptoms completely relieved with migraine cocktail. CBC and BMP unremarkable. Creatinine at baseline.  I have discussed the diagnosis/risks/treatment options with the patient and believe the pt to be eligible for discharge home to follow-up with PCP. We also discussed returning to the ED immediately if new or worsening sx occur. We discussed the sx which are most concerning (e.g., repeat episode) that necessitate immediate return. Medications administered to the patient during their visit and any new prescriptions provided to the patient are listed  below.  Medications given during this visit Medications  sodium chloride 0.9 % bolus 1,000 mL (0 mLs Intravenous Stopped 01/15/15 2040)  ketorolac (TORADOL) 30 MG/ML injection 30 mg (30 mg Intravenous Given 01/15/15 1851)  prochlorperazine (COMPAZINE) injection 10 mg (10 mg Intravenous Given 01/15/15 1852)  diphenhydrAMINE (BENADRYL) injection 25 mg (25 mg Intravenous Given 01/15/15 1851)    Discharge Medication List as of 01/15/2015  8:32 PM       The patient appears reasonably screen and/or stabilized for discharge and I doubt any other medical condition or other [facility] requiring further screening, evaluation, or treatment in the ED at this time prior to discharge.    Deno Etienne, DO 01/15/15 2214

## 2015-01-17 ENCOUNTER — Other Ambulatory Visit: Payer: Self-pay | Admitting: *Deleted

## 2015-01-17 ENCOUNTER — Telehealth: Payer: Self-pay | Admitting: *Deleted

## 2015-01-17 ENCOUNTER — Telehealth: Payer: Self-pay | Admitting: Nurse Practitioner

## 2015-01-17 NOTE — Telephone Encounter (Signed)
Patient aware of 8/15 appointment

## 2015-01-17 NOTE — Telephone Encounter (Signed)
TC from pt  Requesting to move her appt with Dr. Benay Spice up to a soon er date than currently scheduled  (03/13/15). She is requesting this d/t ED visit she had on 01/15/15. The visit was for frontal headache and drooping eyelid and mouth.  She was told this was d/t migraine. No scans done per pt. Pt states she still has headache though not as severe and just has twitching of eyelid but not drooping. She states she "wants to stay on top of things"

## 2015-01-17 NOTE — Telephone Encounter (Signed)
POF sent to schedule pt Monday per Dr. Benay Spice

## 2015-01-20 ENCOUNTER — Other Ambulatory Visit: Payer: Self-pay | Admitting: Obstetrics

## 2015-01-20 ENCOUNTER — Ambulatory Visit (HOSPITAL_BASED_OUTPATIENT_CLINIC_OR_DEPARTMENT_OTHER): Payer: Medicare Other | Admitting: Nurse Practitioner

## 2015-01-20 VITALS — BP 108/62 | HR 63 | Temp 99.6°F | Resp 17 | Ht 66.0 in | Wt 176.1 lb

## 2015-01-20 DIAGNOSIS — C755 Malignant neoplasm of aortic body and other paraganglia: Secondary | ICD-10-CM | POA: Diagnosis not present

## 2015-01-20 DIAGNOSIS — H02401 Unspecified ptosis of right eyelid: Secondary | ICD-10-CM

## 2015-01-20 DIAGNOSIS — Z86018 Personal history of other benign neoplasm: Secondary | ICD-10-CM

## 2015-01-20 NOTE — Progress Notes (Addendum)
[place] OFFICE PROGRESS NOTE   Diagnosis:  Paraganglioma  INTERVAL HISTORY:   Ms. Maret returns prior to scheduled follow-up. Last week she had 2 episodes of the right eyelid closing and a right facial droop. Symptoms lasted 1-2 minutes each time. With the second episode she also had numbness at the right lower arm which lasted 24 hours and a headache around the right eye. She continues to have "twitches" of the right eyelid. Right neck pain is stable.  Objective:  Vital signs in last 24 hours:  Blood pressure 108/62, pulse 63, temperature 99.6 F (37.6 C), temperature source Oral, resp. rate 17, height _0  (1.676 m), weight 176 lb 1.6 oz (79.878 kg), last menstrual period 01/14/2015, SpO2 100 %.    HEENT: Pupils equal round and reactive to light. Extraocular movements intact. Lymphatics: No palpable cervical or supra clavicular lymph nodes. Resp: Lungs clear bilaterally. Cardio: Regular rate and rhythm. GI: Abdomen soft and nontender. No hepatomegaly. Vascular: No leg edema. Neuro: Alert and oriented. Motor sign 5 over 5. Finger to nose intact. Mild deviation of the tongue to the left.    Lab Results:  Lab Results  Component Value Date   WBC 4.6 01/15/2015   HGB 12.3 01/15/2015   HCT 37.8 01/15/2015   MCV 95.0 01/15/2015   PLT 221 01/15/2015   NEUTROABS 1.6* 01/15/2015    Imaging:  No results found.  Medications: I have reviewed the patient's current medications.  Assessment/Plan: 1. 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 Restaging CT scans and a bone scan at [facility] in March 2011 revealed no evidence for disease progression. The serum metanephrine, catecholamine and Chromogranin A levels remained elevated on 10/22/2010. Levels were within normal range 06/10/2014. 2. Intermittent sharp pain at the right face and neck. Potentially related to nerve injury from the surgery/radiation of the skull base versus the  skull base mass. The pain has improved with Neurontin. 3. History of pancytopenia secondary to MIBG therapy. 4. Chronic sinus drainage and headache status post sinus surgery by Dr. Vinetta Bergamo at [facility] in July 2009. This was followed by nasal endoscopy and debridement in September 2009. She underwent a repeat debridement on 10/22/2009. 5. History of hypertension secondary to the paraganglioma, improved 6. Status post Cesarean section delivery October 2007. 7. History of oral candidiasis. 8. Osteopenia on a bone density scan 10/13/2010 with a low estradiol level.  9. Status post gastric bypass surgery and cholecystectomy 06/15/2011. 10. Anemia-question related to the gastric bypass surgery with an element of malnutrition versus myelodysplasia. Diagnostic bone marrow biopsy 02/14/2013 with a slightly hypocellular marrow with a 46XX karyotype, abundant iron stores. Hemoglobin remains stable.  11. Left ear and neck pain 02/08/2014. She completed a course of amoxicillin with no improvement. Brain MRI 03/07/2014 showed stable residual enhancing tumor at the skull base compared to 2009. She was referred to ENT and diagnosed with "TMJ ".   Disposition: Ms. Ortmann appears stable. The etiology of the recent right eyelid/facial droop is unclear. We are referring her for a restaging MRI of the brain and cervical spine. We will see her back in one week. If she has further similar episodes she understands to contact the office.  Patient seen with Dr. Benay Spice.  [name] ANP/GNP-BC   01/20/2015  11:41 AM  This was a shared visit with [name]. Ms. Lichtenwalner was interviewed and examined. The right facial symptoms could be related to progressive base of the skull tumor or a  partial seizure. She will undergo a restaging MRI evaluation and return for an office visit in approximately one week.  Julieanne Manson, M.D.

## 2015-01-21 ENCOUNTER — Telehealth: Payer: Self-pay | Admitting: Nurse Practitioner

## 2015-01-21 NOTE — Telephone Encounter (Signed)
Pt called about scheduling her MRI, I advised her that someone will contact her from [place] and gave her the #.... I also confirmed office visit with NP/KC per 08/15 POF to go over results from MRI.... KJ

## 2015-01-22 ENCOUNTER — Telehealth: Payer: Self-pay | Admitting: *Deleted

## 2015-01-22 MED ORDER — DIAZEPAM 5 MG PO TABS: 2.5000 mg | ORAL_TABLET | Freq: Once | ORAL | Status: DC

## 2015-01-22 NOTE — Telephone Encounter (Signed)
Ok , please ask radilogy to schedule for any available site

## 2015-01-22 NOTE — Telephone Encounter (Signed)
Patient called reporting "I am willing to do a closed MRI if Dr. Benay Spice will order a valium.  I am claustrophobic.  I also need the closed MRI scheduled by Friday to discuss the MRI results with the Nurse Practitioner on Monday."  Uses [facility] on [address].

## 2015-01-22 NOTE — Telephone Encounter (Signed)
Called patient to notify her Valium has been ordered, MRI is scheduled at [facility] at 6:45 pm and she will need a driver to and from.  Spoke with Vaughan Basta and MRI has been authorized.

## 2015-01-22 NOTE — Telephone Encounter (Signed)
Valium called into pt's pharmacy per [name] instruction. Called pt to make her aware, she voices understanding.

## 2015-01-23 ENCOUNTER — Ambulatory Visit (HOSPITAL_COMMUNITY)
Admission: RE | Admit: 2015-01-23 | Discharge: 2015-01-23 | Disposition: A | Discharge status: Home / Self Care | Payer: Medicare Other | Source: Ambulatory Visit | Attending: Nurse Practitioner | Admitting: Nurse Practitioner

## 2015-01-23 DIAGNOSIS — R200 Anesthesia of skin: Secondary | ICD-10-CM | POA: Diagnosis not present

## 2015-01-23 DIAGNOSIS — R29810 Facial weakness: Secondary | ICD-10-CM | POA: Diagnosis not present

## 2015-01-23 DIAGNOSIS — Z8639 Personal history of other endocrine, nutritional and metabolic disease: Secondary | ICD-10-CM | POA: Insufficient documentation

## 2015-01-23 DIAGNOSIS — H02401 Unspecified ptosis of right eyelid: Secondary | ICD-10-CM | POA: Insufficient documentation

## 2015-01-23 DIAGNOSIS — M542 Cervicalgia: Secondary | ICD-10-CM | POA: Insufficient documentation

## 2015-01-23 DIAGNOSIS — D447 Neoplasm of uncertain behavior of aortic body and other paraganglia: Secondary | ICD-10-CM | POA: Insufficient documentation

## 2015-01-23 DIAGNOSIS — Z86018 Personal history of other benign neoplasm: Secondary | ICD-10-CM

## 2015-01-23 DIAGNOSIS — M479 Spondylosis, unspecified: Secondary | ICD-10-CM | POA: Diagnosis not present

## 2015-01-23 DIAGNOSIS — R51 Headache: Secondary | ICD-10-CM | POA: Diagnosis not present

## 2015-01-23 MED ORDER — GADOBENATE DIMEGLUMINE 529 MG/ML IV SOLN
15.0000 mL | Freq: Once | INTRAVENOUS | Status: AC | PRN
  Administered 2015-01-23: 14 mL via INTRAVENOUS

## 2015-01-27 ENCOUNTER — Telehealth: Payer: Self-pay | Admitting: *Deleted

## 2015-01-27 ENCOUNTER — Ambulatory Visit: Payer: Medicare Other | Admitting: Oncology

## 2015-01-27 NOTE — Telephone Encounter (Signed)
-----   Message from Ladell Pier, MD sent at 01/24/2015  4:49 PM EDT ----- Please call patient, skull based tumor is unchanged, no new findings in brain or spine

## 2015-01-27 NOTE — Telephone Encounter (Signed)
Per Dr. Benay Spice; notified pt that MR showed skull based tumor is unchanged, no new findings in brain or spine.  Pt verbalized understanding and states "I won't need to come in this am then, the NP was just going to go over results"  Mikey Bussing, NP/Dr. Benay Spice made aware.

## 2015-03-01 ENCOUNTER — Other Ambulatory Visit: Payer: Self-pay | Admitting: Obstetrics

## 2015-03-06 ENCOUNTER — Other Ambulatory Visit: Payer: Medicare Other

## 2015-03-13 ENCOUNTER — Ambulatory Visit: Payer: Medicare Other | Admitting: Nurse Practitioner

## 2015-04-05 ENCOUNTER — Other Ambulatory Visit: Payer: Self-pay | Admitting: Oncology

## 2015-04-11 ENCOUNTER — Telehealth (HOSPITAL_COMMUNITY): Payer: Self-pay

## 2015-04-11 NOTE — Telephone Encounter (Signed)
This patient is overdue for recommended follow-up with a bariatric surgeon at [facility]. A mailing was sent to the patient on 01/31/15 along with a patient survey from both [place] with no response yet at this time.  Call attempted today to reestablish post-op care with [facility]. Patient advised that she did not realize yearly care was recommended & she will reach out to [facility] next week to schedule an appt. [facility] informed of conversation in the event they need to follow-up.   [name]. [facility] Bariatric Office Coordinator [phone]

## 2015-04-18 ENCOUNTER — Other Ambulatory Visit: Payer: Self-pay | Admitting: Oncology

## 2015-04-19 ENCOUNTER — Other Ambulatory Visit: Payer: Self-pay | Admitting: Obstetrics

## 2015-06-02 ENCOUNTER — Other Ambulatory Visit: Payer: Self-pay | Admitting: Obstetrics

## 2015-06-30 ENCOUNTER — Other Ambulatory Visit: Payer: Self-pay | Admitting: *Deleted

## 2015-08-12 ENCOUNTER — Telehealth: Payer: Self-pay | Admitting: *Deleted

## 2015-08-12 NOTE — Telephone Encounter (Signed)
Fax from pharmacy requesting refill of OCP- patient last visit-2015. She got a refill last year with promise of scheduling an annual which she did not. She is oncology patient. Refill denied.

## 2015-09-29 ENCOUNTER — Other Ambulatory Visit: Payer: Self-pay

## 2015-09-29 DIAGNOSIS — Z1231 Encounter for screening mammogram for malignant neoplasm of breast: Secondary | ICD-10-CM

## 2015-10-30 ENCOUNTER — Ambulatory Visit
Admission: RE | Admit: 2015-10-30 | Discharge: 2015-10-30 | Disposition: A | Discharge status: Home / Self Care | Payer: 59 | Source: Ambulatory Visit

## 2015-10-30 DIAGNOSIS — Z1231 Encounter for screening mammogram for malignant neoplasm of breast: Secondary | ICD-10-CM

## 2015-11-04 ENCOUNTER — Other Ambulatory Visit: Payer: Self-pay | Admitting: *Deleted

## 2015-11-04 MED ORDER — GABAPENTIN 100 MG PO CAPS
100.0000 mg | ORAL_CAPSULE | Freq: Two times a day (BID) | ORAL | Status: DC
Start: 2015-11-04 — End: 2017-02-09

## 2015-11-04 NOTE — Telephone Encounter (Signed)
Received refill request from [facility] for Gabapentin. Noted pt had not been seen in office since 01/2015. Called pt, she reports her SS disability had been terminated- she returned to work and recently completed a payment plan for her bill.  She agrees to schedule an office visit next month. Requests appt late in the day. She is now using mail order pharmacy, requests 90 day supply of Gabapentin.  Rx refilled, per Dr. Benay Spice.

## 2015-11-05 ENCOUNTER — Telehealth: Payer: Self-pay | Admitting: Oncology

## 2015-11-05 NOTE — Telephone Encounter (Signed)
cld pt and voice mail not set up. Mailed copy of sch

## 2015-12-05 ENCOUNTER — Ambulatory Visit (HOSPITAL_BASED_OUTPATIENT_CLINIC_OR_DEPARTMENT_OTHER): Payer: 59 | Admitting: Nurse Practitioner

## 2015-12-05 ENCOUNTER — Telehealth: Payer: Self-pay | Admitting: Oncology

## 2015-12-05 VITALS — BP 136/66 | HR 62 | Temp 98.6°F | Resp 18 | Ht 66.0 in | Wt 182.0 lb

## 2015-12-05 DIAGNOSIS — Z8589 Personal history of malignant neoplasm of other organs and systems: Secondary | ICD-10-CM | POA: Diagnosis not present

## 2015-12-05 DIAGNOSIS — Z86018 Personal history of other benign neoplasm: Secondary | ICD-10-CM

## 2015-12-05 NOTE — Progress Notes (Signed)
  [place] OFFICE PROGRESS NOTE   Diagnosis:  Paraganglioma  INTERVAL HISTORY:   [name] was last seen at the [facility] in August 2016. She did not return for subsequent follow-up visits due to a loss of health insurance.  She overall feels well. She has stable intermittent right neck pain. She has periodic headaches. No nausea or vomiting. No diarrhea. She has a good appetite.  Objective:  Vital signs in last 24 hours:  Blood pressure 136/66, pulse 62, temperature 98.6 F (37 C), temperature source Oral, resp. rate 18, height '5\' 6"'$  (1.676 m), weight 182 lb (82.555 kg), SpO2 100 %.    HEENT: No thrush or ulcers. Lymphatics: No palpable cervical, supra clavicular or axillary lymph nodes. Resp: Lungs clear bilaterally. Cardio: Regular rate and rhythm. GI: Abdomen soft and nontender. No organomegaly. Vascular: No leg edema.    Lab Results:  Lab Results  Component Value Date   WBC 4.6 01/15/2015   HGB 12.3 01/15/2015   HCT 37.8 01/15/2015   MCV 95.0 01/15/2015   PLT 221 01/15/2015   NEUTROABS 1.6* 01/15/2015    Imaging:  No results found.  Medications: I have reviewed the patient's current medications.  Assessment/Plan: 1. 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 Restaging CT scans and a bone scan at [facility] in March 2011 revealed no evidence for disease progression. The serum metanephrine, catecholamine and Chromogranin A levels remained elevated on 10/22/2010. Levels were within normal range 06/10/2014. 2. Intermittent sharp pain at the right face and neck. Potentially related to nerve injury from the surgery/radiation of the skull base versus the skull base mass. The pain has improved with Neurontin. 3. History of pancytopenia secondary to MIBG therapy. 4. Chronic sinus drainage and headache status post sinus surgery by Dr. Vinetta Bergamo at [facility] in July 2009. This was followed by nasal endoscopy and  debridement in September 2009. She underwent a repeat debridement on 10/22/2009. 5. History of hypertension secondary to the paraganglioma, improved 6. Status post Cesarean section delivery October 2007. 7. History of oral candidiasis. 8. Osteopenia on a bone density scan 10/13/2010 with a low estradiol level.  9. Status post gastric bypass surgery and cholecystectomy 06/15/2011. 10. Anemia-question related to the gastric bypass surgery with an element of malnutrition versus myelodysplasia. Diagnostic bone marrow biopsy 02/14/2013 with a slightly hypocellular marrow with a 46XX karyotype, abundant iron stores. Hemoglobin remains stable. 11. Left ear and neck pain 02/08/2014. She completed a course of amoxicillin with no improvement. Brain MRI 03/07/2014 showed stable residual enhancing tumor at the skull base compared to 2009. She was referred to ENT and diagnosed with "TMJ ". 12. Brain MRI and cervical spine MRI 01/23/2015. Unchanged appearance of the brain without evidence of acute/subacute infarct. No significant interval change in the appearance of the residual skull base tumor. Minimal cervical spondylosis without stenosis.   Disposition: [name] appears stable from an oncology standpoint. We will repeat the serum hormone levels when she returns for a follow-up visit in 4 months. She will contact the office in the interim with any problems.    [name] ANP/GNP-BC   12/05/2015  3:32 PM

## 2015-12-05 NOTE — Telephone Encounter (Signed)
Gave and printed appt sched and avs fo rpt for OCT °

## 2015-12-19 ENCOUNTER — Ambulatory Visit (INDEPENDENT_AMBULATORY_CARE_PROVIDER_SITE_OTHER): Payer: 59 | Admitting: Certified Nurse Midwife

## 2015-12-19 ENCOUNTER — Encounter: Payer: Self-pay | Admitting: Certified Nurse Midwife

## 2015-12-19 ENCOUNTER — Other Ambulatory Visit: Payer: Self-pay | Admitting: Certified Nurse Midwife

## 2015-12-19 VITALS — BP 134/85 | HR 59 | Temp 99.1°F | Wt 184.8 lb

## 2015-12-19 DIAGNOSIS — Z01419 Encounter for gynecological examination (general) (routine) without abnormal findings: Secondary | ICD-10-CM

## 2015-12-19 DIAGNOSIS — N907 Vulvar cyst: Secondary | ICD-10-CM | POA: Diagnosis not present

## 2015-12-19 DIAGNOSIS — Z1389 Encounter for screening for other disorder: Secondary | ICD-10-CM | POA: Diagnosis not present

## 2015-12-19 DIAGNOSIS — Z803 Family history of malignant neoplasm of breast: Secondary | ICD-10-CM

## 2015-12-19 DIAGNOSIS — N9089 Other specified noninflammatory disorders of vulva and perineum: Secondary | ICD-10-CM

## 2015-12-19 DIAGNOSIS — Z0000 Encounter for general adult medical examination without abnormal findings: Secondary | ICD-10-CM

## 2015-12-19 DIAGNOSIS — N390 Urinary tract infection, site not specified: Secondary | ICD-10-CM

## 2015-12-19 DIAGNOSIS — Z3041 Encounter for surveillance of contraceptive pills: Secondary | ICD-10-CM

## 2015-12-19 DIAGNOSIS — Z8619 Personal history of other infectious and parasitic diseases: Secondary | ICD-10-CM

## 2015-12-19 LAB — POCT URINALYSIS DIPSTICK
Bilirubin, UA: NEGATIVE
Blood, UA: NEGATIVE
Glucose, UA: NORMAL
Ketones, UA: NEGATIVE
PH UA: 5
Protein, UA: NEGATIVE
Spec Grav, UA: 1.015
Urobilinogen, UA: NEGATIVE

## 2015-12-19 MED ORDER — NITROFURANTOIN MONOHYD MACRO 100 MG PO CAPS: 100.0000 mg | ORAL_CAPSULE | Freq: Two times a day (BID) | ORAL | Status: DC

## 2015-12-19 MED ORDER — NITROFURANTOIN MONOHYD MACRO 100 MG PO CAPS: 100.0000 mg | ORAL_CAPSULE | Freq: Two times a day (BID) | ORAL | Status: AC

## 2015-12-19 MED ORDER — NORETHINDRONE ACET-ETHINYL EST 1-20 MG-MCG PO TABS
1.0000 | ORAL_TABLET | Freq: Every day | ORAL | Status: DC
Start: 2015-12-19 — End: 2017-01-17

## 2015-12-19 NOTE — Progress Notes (Signed)
Patient ID: [name], female   DOB: Jul 25, 1970, 45 y.o.   MRN: [id number]    Subjective:      [name] is a 45 y.o. female here for a routine exam.  Current complaints: none.  Post cancer patient, had port.  Goes to [facility] for monitoring.  Gets HA from brain tumor.    Married, currently sexually active.    Personal health questionnaire:  Is patient Ashkenazi Jewish, have a family history of breast and/or ovarian cancer: yes, mother & sister, 2 maternal aunts.  Mom deceased 57 BCA reoccurrence.   Is there a family history of uterine cancer diagnosed at age < 20, gastrointestinal cancer, urinary tract cancer, family member who is a Field seismologist syndrome-associated carrier: no Is the patient overweight and hypertensive, family history of diabetes, personal history of gestational diabetes, preeclampsia or PCOS: yes Is patient over 54, have PCOS,  family history of premature CHD under age 1, diabetes, smoke, have hypertension or peripheral artery disease:  no At any time, has a partner hit, kicked or otherwise hurt or frightened you?: no Over the past 2 weeks, have you felt down, depressed or hopeless?: no Over the past 2 weeks, have you felt little interest or pleasure in doing things?:no   Gynecologic History No LMP recorded. Contraception: OCP (estrogen/progesterone) and tubal ligation Last Pap: 10/2013. Results were: normal Last mammogram: 10/2015. Results were: normal  Obstetric History OB History  Gravida Para Term Preterm AB SAB TAB Ectopic Multiple Living  5 3 3  2 1 1   3     # Outcome Date GA Lbr Len/2nd Weight Sex Delivery Anes PTL Lv  5 Term 03/24/06 [redacted]w[redacted]d  6 lb 15 oz (3.147 kg) F CS-LTranv EPI  Y  4 SAB 2002        N  3 Term 12/25/91 [redacted]w[redacted]d  8 lb 2 oz (3.685 kg) F Vag-Spont None  Y  2 Term 10/07/89 [redacted]w[redacted]d  8 lb (3.629 kg) M Vag-Spont None  Y  1 TAB         N      Past Medical History  Diagnosis Date  . Hypertension   . Blood transfusion     2003 , 2004   . GERD  (gastroesophageal reflux disease)   . Headache(784.0)   . Cancer [facility])     pheochromocytoma, paraganglioma  . Elbow injury     10/12 due to fall     Past Surgical History  Procedure Laterality Date  . Adrenalectomy  2004    Pheochromocytoma  . Cesarean section    . Brain surgery  2004     Recurrent Pheochromocytoma, 2006- chemo amd rad tx   . Other surgical history      removed scar tissue from uterus 2000  . Gastric roux-en-y  06/15/2011    Procedure: LAPAROSCOPIC ROUX-EN-Y GASTRIC;  Surgeon: Edward Jolly, MD;  Location: [facility] ORS;  Service: General;  Laterality: N/A;  upper endoscopy  . Cholecystectomy  06/15/2011    Procedure: LAPAROSCOPIC CHOLECYSTECTOMY WITH INTRAOPERATIVE CHOLANGIOGRAM;  Surgeon: Edward Jolly, MD;  Location: [facility] ORS;  Service: General;  Laterality: N/A;  . Tubal ligation  03/24/2006     Current outpatient prescriptions:  .  cyclobenzaprine (FLEXERIL) 5 MG tablet, TAKE 1 TABLET 3 TIMES A DAY AS NEEDED FOR MUSCLE SPASMS, Disp: 30 tablet, Rfl: 0 .  diazepam (VALIUM) 5 MG tablet, Take 0.5 tablets (2.5 mg total) by mouth once. 1 HR PRIOR TO MRI appt. May  repeat x 1 if needed. PATIENT WILL NEED A DRIVER TO AND FROM MRI, Disp: 1 tablet, Rfl: 0 .  ferrous sulfate 324 (65 FE) MG TBEC, Take 1 tablet by mouth daily. Take 3 times a week, Disp: , Rfl:  .  gabapentin (NEURONTIN) 100 MG capsule, Take 1 capsule (100 mg total) by mouth 2 (two) times daily., Disp: 180 capsule, Rfl: 1 .  HYDROcodone-acetaminophen (NORCO/VICODIN) 5-325 MG per tablet, Take 1-2 tablets by mouth every 6 (six) hours as needed., Disp: , Rfl: 0 .  Multiple Vitamin (MULTIVITAMIN) tablet, Take 2 tablets by mouth daily. 2 chewable multivitamins daily, Disp: , Rfl:  .  norethindrone-ethinyl estradiol (JUNEL 1/20) 1-20 MG-MCG tablet, Take 1 tablet by mouth daily. (Patient not taking: Reported on 12/05/2015), Disp: 1 Package, Rfl: 11 .  omeprazole (PRILOSEC) 40 MG capsule, Take 40 mg by mouth daily. ,  Disp: , Rfl:  .  traMADol (ULTRAM) 50 MG tablet, TAKE 1 TABLET BY MOUTH EVERY 6 HOURS AS NEEDED (Patient taking differently: TAKE 1 TABLET BY MOUTH EVERY 6 HOURS AS NEEDED for Pain), Disp: 50 tablet, Rfl: 0 Allergies  Allergen Reactions  . Enalapril Swelling and Rash    Social History  Substance Use Topics  . Smoking status: Never Smoker   . Smokeless tobacco: Never Used  . Alcohol Use: Yes     Comment: occasional     Family History  Problem Relation Age of Onset  . Cancer Mother     breast  . Cancer Maternal Aunt     breast      Review of Systems  Constitutional: negative for fatigue and weight loss Respiratory: negative for cough and wheezing Cardiovascular: negative for chest pain, fatigue and palpitations Gastrointestinal: negative for abdominal pain and change in bowel habits Musculoskeletal:negative for myalgias Neurological: negative for gait problems and tremors Behavioral/Psych: negative for abusive relationship, depression Endocrine: negative for temperature intolerance   Genitourinary:negative for abnormal menstrual periods, genital lesions, hot flashes, sexual problems and vaginal discharge Integument/breast: negative for breast lump, breast tenderness, nipple discharge and skin lesion(s)    Objective:       BP 134/85 mmHg  Pulse 59  Temp(Src) 99.1 F (37.3 C) (Oral)  Wt 184 lb 12.8 oz (83.825 kg) General:   alert  Skin:   no rash or abnormalities  Lungs:   clear to auscultation bilaterally  Heart:   regular rate and rhythm, S1, S2 normal, no murmur, click, rub or gallop  Breasts:   normal without suspicious masses, skin or nipple changes or axillary nodes  Abdomen:  normal findings: no organomegaly, soft, non-tender and no hernia  Pelvis:  External genitalia: normal general appearance Urinary system: urethral meatus normal and bladder without fullness, nontender Vaginal: normal without tenderness, induration or masses Cervix: normal  appearance Adnexa: normal bimanual exam Uterus: anteverted and non-tender, normal size    Skin Tag Removal Procedure Note    PROCEDURE: Skin tag removal Performing Provider: Kandis Cocking CNM   Patient education prior to procedure, explained risk, benefits of skin tag removal, reviewed alternative options. Patient reported understanding. Gave consent to continue with procedure.   PROCEDURE:  Pregnancy Text :  not indicated       Sterile Preparation:   Betadinex3    The patient's left labia was palpated and the skin tag located. The area was prepped with Betadinex3. The area around the skin tag was anesthestized with 1.5 cc of 1% lidocaine without epinephrine was injected. A 1.5 mm incision was made  and the skin tag was removed in an intact manner. Skin tag sent to pathology.  Hemostasis.     Followup: The patient tolerated the procedure well without complications.  Standard post-procedure care is explained and return precautions are given.  Sent to pathology.    Kandis Cocking CNM  Lab Review Urine pregnancy test Labs reviewed yes Radiologic studies reviewed yes  50% of 30 min visit spent on counseling and coordination of care.   Assessment:    Healthy female exam.   Family H/O BCA: mother deceased  Personal hx of brain tumor  Well woman exam  H/O genital warts  Skin tag removal Plan:    Education reviewed: calcium supplements, depression evaluation, low fat, low cholesterol diet, safe sex/STD prevention, self breast exams, skin cancer screening and weight bearing exercise. Contraception: tubal ligation. Follow up in: 1 year.   No orders of the defined types were placed in this encounter.   Orders Placed This Encounter  Procedures  . POCT urinalysis dipstick    Associate with diagnosis code Z13.89   Need to obtain previous records Possible management options include: genetics counseling Follow up as needed.

## 2015-12-23 ENCOUNTER — Other Ambulatory Visit: Payer: Self-pay | Admitting: Certified Nurse Midwife

## 2015-12-23 ENCOUNTER — Telehealth: Payer: Self-pay | Admitting: Certified Nurse Midwife

## 2015-12-23 DIAGNOSIS — B9689 Other specified bacterial agents as the cause of diseases classified elsewhere: Secondary | ICD-10-CM

## 2015-12-23 DIAGNOSIS — N760 Acute vaginitis: Secondary | ICD-10-CM

## 2015-12-23 LAB — NUSWAB VG, CANDIDA 6SP
Atopobium vaginae: HIGH Score — AB
BVAB 2: HIGH Score — AB
CANDIDA GLABRATA, NAA: NEGATIVE
CANDIDA PARAPSILOSIS, NAA: NEGATIVE
CANDIDA TROPICALIS, NAA: NEGATIVE
Candida albicans, NAA: NEGATIVE
Candida krusei, NAA: NEGATIVE
Candida lusitaniae, NAA: NEGATIVE
MEGASPHAERA 1: HIGH {score} — AB
Trich vag by NAA: NEGATIVE

## 2015-12-23 LAB — PAP IG AND HPV HIGH-RISK
HPV, high-risk: NEGATIVE
PAP Smear Comment: 0

## 2015-12-23 MED ORDER — FLUCONAZOLE 200 MG PO TABS: 200.0000 mg | ORAL_TABLET | Freq: Once | ORAL | Status: DC

## 2015-12-23 MED ORDER — TERCONAZOLE 0.8 % VA CREA: 1.0000 | TOPICAL_CREAM | Freq: Every day | VAGINAL | Status: DC

## 2015-12-23 MED ORDER — METRONIDAZOLE 500 MG PO TABS: 500.0000 mg | ORAL_TABLET | Freq: Two times a day (BID) | ORAL | Status: DC

## 2015-12-23 NOTE — Telephone Encounter (Signed)
Please let her know that I have sent in medications because she thinks she has a yeast infection.  Diflucan and Terconazole vaginal cream was sent to the pharmacy for her to use.  Thank you. R.Brendan Gruwell CNM

## 2015-12-23 NOTE — Telephone Encounter (Signed)
Pt called and stated to have yeast inf. Pt is requesting Rx, pt was taking nitrofurantoin, macrocrystal-monohydrate, (MACROBID) 100 MG capsule.   [name] [place]

## 2015-12-25 ENCOUNTER — Telehealth: Payer: Self-pay | Admitting: *Deleted

## 2015-12-25 ENCOUNTER — Encounter: Payer: Self-pay | Admitting: *Deleted

## 2015-12-25 NOTE — Telephone Encounter (Signed)
Letter sent for this encounter.

## 2016-03-02 ENCOUNTER — Telehealth: Payer: Self-pay

## 2016-03-10 ENCOUNTER — Encounter (HOSPITAL_COMMUNITY): Payer: Self-pay

## 2016-03-30 ENCOUNTER — Other Ambulatory Visit: Payer: 59

## 2016-03-30 ENCOUNTER — Ambulatory Visit: Payer: 59 | Admitting: Oncology

## 2016-04-16 ENCOUNTER — Ambulatory Visit (HOSPITAL_BASED_OUTPATIENT_CLINIC_OR_DEPARTMENT_OTHER): Payer: 59 | Admitting: Oncology

## 2016-04-16 ENCOUNTER — Ambulatory Visit (HOSPITAL_BASED_OUTPATIENT_CLINIC_OR_DEPARTMENT_OTHER): Payer: 59

## 2016-04-16 VITALS — BP 131/81 | HR 57 | Temp 98.3°F | Resp 18 | Ht 66.0 in | Wt 182.0 lb

## 2016-04-16 DIAGNOSIS — D447 Neoplasm of uncertain behavior of aortic body and other paraganglia: Secondary | ICD-10-CM

## 2016-04-16 DIAGNOSIS — D709 Neutropenia, unspecified: Secondary | ICD-10-CM | POA: Diagnosis not present

## 2016-04-16 DIAGNOSIS — Z86018 Personal history of other benign neoplasm: Secondary | ICD-10-CM

## 2016-04-16 DIAGNOSIS — C755 Malignant neoplasm of aortic body and other paraganglia: Secondary | ICD-10-CM

## 2016-04-16 DIAGNOSIS — D649 Anemia, unspecified: Secondary | ICD-10-CM | POA: Diagnosis not present

## 2016-04-16 LAB — COMPREHENSIVE METABOLIC PANEL
ALT: 39 U/L (ref 0–55)
ANION GAP: 7 meq/L (ref 3–11)
AST: 52 U/L — ABNORMAL HIGH (ref 5–34)
Albumin: 3.3 g/dL — ABNORMAL LOW (ref 3.5–5.0)
Alkaline Phosphatase: 207 U/L — ABNORMAL HIGH (ref 40–150)
BUN: 14.2 mg/dL (ref 7.0–26.0)
CALCIUM: 9.1 mg/dL (ref 8.4–10.4)
CHLORIDE: 110 meq/L — AB (ref 98–109)
CO2: 23 mEq/L (ref 22–29)
CREATININE: 1.1 mg/dL (ref 0.6–1.1)
EGFR: 70 mL/min/{1.73_m2} — ABNORMAL LOW (ref >90)
Glucose: 84 mg/dl (ref 70–140)
Potassium: 4.4 mEq/L (ref 3.5–5.1)
Sodium: 140 mEq/L (ref 136–145)
Total Bilirubin: 0.42 mg/dL (ref 0.20–1.20)
Total Protein: 7.3 g/dL (ref 6.4–8.3)

## 2016-04-16 LAB — CBC WITH DIFFERENTIAL/PLATELET
BASO%: 0.8 % (ref 0.0–2.0)
BASOS ABS: 0 10*3/uL (ref 0.0–0.1)
EOS ABS: 0.1 10*3/uL (ref 0.0–0.5)
EOS%: 3.7 % (ref 0.0–7.0)
HCT: 37.7 % (ref 34.8–46.6)
HGB: 12.4 g/dL (ref 11.6–15.9)
LYMPH#: 2.3 10*3/uL (ref 0.9–3.3)
LYMPH%: 61.6 % — ABNORMAL HIGH (ref 14.0–49.7)
MCH: 31.1 pg (ref 25.1–34.0)
MCHC: 32.9 g/dL (ref 31.5–36.0)
MCV: 94.5 fL (ref 79.5–101.0)
MONO#: 0.3 10*3/uL (ref 0.1–0.9)
MONO%: 7.7 % (ref 0.0–14.0)
NEUT#: 1 10*3/uL — ABNORMAL LOW (ref 1.5–6.5)
NEUT%: 26.2 % — AB (ref 38.4–76.8)
PLATELETS: 196 10*3/uL (ref 145–400)
RBC: 3.99 10*6/uL (ref 3.70–5.45)
RDW: 13.7 % (ref 11.2–14.5)
WBC: 3.8 10*3/uL — ABNORMAL LOW (ref 3.9–10.3)

## 2016-04-16 NOTE — Progress Notes (Signed)
  [place] OFFICE PROGRESS NOTE   Diagnosis: Paraganglioma  INTERVAL HISTORY:   [name] returns as scheduled. She reports stable intermittent headaches and right facial/neck pain. She takes tramadol and Flexeril intermittently for pain. No other complaint.  Objective:  Vital signs in last 24 hours:  Blood pressure 131/81, pulse (!) 57, temperature 98.3 F (36.8 C), temperature source Oral, resp. rate 18, height '5\' 6"'$  (1.676 m), weight 182 lb (82.6 kg), SpO2 100 %.    HEENT: Neck without mass. Oropharynx without visible mass. Lymphatics: No cervical, supra-clavicular, or axillary nodes Resp: Lungs clear bilaterally Cardio: Regular rate and rhythm GI: No hepatosplenomegaly, no mass, nontender Vascular: No leg edema   Lab Results:  Lab Results  Component Value Date   WBC 3.8 (L) 04/16/2016   HGB 12.4 04/16/2016   HCT 37.7 04/16/2016   MCV 94.5 04/16/2016   PLT 196 04/16/2016   NEUTROABS 1.0 (L) 04/16/2016     Medications: I have reviewed the patient's current medications.  1. 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 Restaging CT scans and a bone scan at [facility] in March 2011 revealed no evidence for disease progression. The serum metanephrine, catecholamine and Chromogranin A levels remained elevated on 10/22/2010. Levels were within normal range 06/10/2014. 2. Intermittent sharp pain at the right face and neck. Potentially related to nerve injury from the surgery/radiation of the skull base versus the skull base mass. The pain has improved with Neurontin. 3. History of pancytopenia secondary to MIBG therapy. 4. Chronic sinus drainage and headache status post sinus surgery by Dr. Vinetta Bergamo at [facility] in July 2009. This was followed by nasal endoscopy and debridement in September 2009. She underwent a repeat debridement on 10/22/2009. 5. History of hypertension secondary to the paraganglioma, improved 6. Status post  Cesarean section delivery October 2007. 7. History of oral candidiasis. 8. Osteopenia on a bone density scan 10/13/2010 with a low estradiol level.  9. Status post gastric bypass surgery and cholecystectomy 06/15/2011. 10. Anemia-question related to the gastric bypass surgery with an element of malnutrition versus myelodysplasia. Diagnostic bone marrow biopsy 02/14/2013 with a slightly hypocellular marrow with a 46XX karyotype, abundant iron stores. Hemoglobin remains stable. 11. Left ear and neck pain 02/08/2014. She completed a course of amoxicillin with no improvement. Brain MRI 03/07/2014 showed stable residual enhancing tumor at the skull base compared to 2009. She was referred to ENT and diagnosed with "TMJ ". 12. Brain MRI and cervical spine MRI 01/23/2015. Unchanged appearance of the brain without evidence of acute/subacute infarct. No significant interval change in the appearance of the residual skull base tumor. Minimal cervical spondylosis without stenosis.     Disposition:  [name] appears unchanged. The mild neutropenia is likely benign normal variant . We will check a CBC in a few weeks. There is no clinical evidence for progression of the paraganglioma. We will repeat urine metanephrines when she returns in 8 months.  Betsy Coder, MD  04/16/2016  11:00 AM

## 2016-04-19 ENCOUNTER — Telehealth: Payer: Self-pay | Admitting: *Deleted

## 2016-04-19 ENCOUNTER — Other Ambulatory Visit: Payer: Self-pay | Admitting: Nurse Practitioner

## 2016-04-19 ENCOUNTER — Other Ambulatory Visit: Payer: Self-pay | Admitting: *Deleted

## 2016-04-19 DIAGNOSIS — M542 Cervicalgia: Secondary | ICD-10-CM

## 2016-04-19 DIAGNOSIS — Z86018 Personal history of other benign neoplasm: Secondary | ICD-10-CM

## 2016-04-19 LAB — CHROMOGRANIN A: Chromogranin A: 1 nmol/L (ref 0–5)

## 2016-04-19 MED ORDER — CYCLOBENZAPRINE HCL 5 MG PO TABS: 5.0000 mg | ORAL_TABLET | Freq: Two times a day (BID) | ORAL | 1 refills | Status: DC | PRN

## 2016-04-19 NOTE — Telephone Encounter (Signed)
-----   Message from Ladell Pier, MD sent at 04/16/2016  4:42 PM EST ----- Please call patient, wbcs are mildly low, likely a normal variant, repeat cbc 2-3 weeks

## 2016-04-19 NOTE — Telephone Encounter (Signed)
Informed pt of plan to repeat lab in 2-3 weeks. Likely a normal fluctuation in WBC per Dr. Benay Spice.

## 2016-04-21 LAB — CATECHOLAMINES, FRACTIONATED, PLASMA
Epinephrine, Pl: <15 pg/mL (ref 0–62)
Norepinephrine, Pl: 863 pg/mL (ref 0–874)

## 2016-04-26 ENCOUNTER — Telehealth: Payer: Self-pay | Admitting: General Practice

## 2016-04-26 NOTE — Telephone Encounter (Signed)
Spoke with pt 05/07/16 appt can not keep need to resch for 05/12/16 after 11:00 am. July 2018  appt  Is fine.

## 2016-04-30 NOTE — Telephone Encounter (Signed)
Returned call to patient re rescheduling 12/1 lab. Gave patient new lab appointment for 12/6 @ 11:30 am.

## 2016-05-07 ENCOUNTER — Other Ambulatory Visit: Payer: 59

## 2016-05-12 ENCOUNTER — Telehealth: Payer: Self-pay | Admitting: *Deleted

## 2016-05-12 ENCOUNTER — Other Ambulatory Visit (HOSPITAL_BASED_OUTPATIENT_CLINIC_OR_DEPARTMENT_OTHER): Payer: 59

## 2016-05-12 DIAGNOSIS — Z86018 Personal history of other benign neoplasm: Secondary | ICD-10-CM

## 2016-05-12 DIAGNOSIS — C755 Malignant neoplasm of aortic body and other paraganglia: Secondary | ICD-10-CM

## 2016-05-12 LAB — CBC WITH DIFFERENTIAL/PLATELET
BASO%: 0.7 % (ref 0.0–2.0)
Basophils Absolute: 0 10*3/uL (ref 0.0–0.1)
EOS%: 2.1 % (ref 0.0–7.0)
Eosinophils Absolute: 0.1 10*3/uL (ref 0.0–0.5)
HEMATOCRIT: 38.4 % (ref 34.8–46.6)
HGB: 12.9 g/dL (ref 11.6–15.9)
LYMPH#: 2.2 10*3/uL (ref 0.9–3.3)
LYMPH%: 51.2 % — AB (ref 14.0–49.7)
MCH: 31.4 pg (ref 25.1–34.0)
MCHC: 33.6 g/dL (ref 31.5–36.0)
MCV: 93.4 fL (ref 79.5–101.0)
MONO#: 0.3 10*3/uL (ref 0.1–0.9)
MONO%: 7.5 % (ref 0.0–14.0)
NEUT%: 38.5 % (ref 38.4–76.8)
NEUTROS ABS: 1.6 10*3/uL (ref 1.5–6.5)
Platelets: 150 10*3/uL (ref 145–400)
RBC: 4.11 10*6/uL (ref 3.70–5.45)
RDW: 13.7 % (ref 11.2–14.5)
WBC: 4.2 10*3/uL (ref 3.9–10.3)
nRBC: 0 % (ref 0–0)

## 2016-05-12 NOTE — Telephone Encounter (Signed)
-----   Message from Ladell Pier, MD sent at 05/12/2016  3:05 PM EST ----- Please call patient Neutrophils are better,f/u as scheduled

## 2016-05-12 NOTE — Telephone Encounter (Signed)
Per Dr. Benay Spice, patient notified that neutrophils are better and to f/u as scheduled.  Patient appreciative of call and has no questions at this time.

## 2016-10-19 ENCOUNTER — Other Ambulatory Visit: Payer: Self-pay | Admitting: Internal Medicine

## 2016-10-19 DIAGNOSIS — Z1231 Encounter for screening mammogram for malignant neoplasm of breast: Secondary | ICD-10-CM

## 2016-11-04 ENCOUNTER — Ambulatory Visit
Admission: RE | Admit: 2016-11-04 | Discharge: 2016-11-04 | Disposition: A | Discharge status: Home / Self Care | Payer: 59 | Source: Ambulatory Visit | Attending: Internal Medicine | Admitting: Internal Medicine

## 2016-11-04 DIAGNOSIS — Z1231 Encounter for screening mammogram for malignant neoplasm of breast: Secondary | ICD-10-CM

## 2016-11-28 ENCOUNTER — Telehealth: Payer: Self-pay | Admitting: Oncology

## 2016-11-28 NOTE — Telephone Encounter (Signed)
S/w pt, advised 7/13 appt moved due to md pal. Gave pt appt for 8/13 @ 2.30pm.

## 2016-12-16 LAB — VANILLYLMANDELIC ACID, 24-HR U
VMA, URINE, 24HR: 3.2 mg/(24.h) (ref 0.0–7.5)
VMA, Urine: 2.8 mg/L

## 2016-12-17 ENCOUNTER — Other Ambulatory Visit: Payer: 59

## 2016-12-17 ENCOUNTER — Ambulatory Visit: Payer: 59 | Admitting: Oncology

## 2017-01-10 ENCOUNTER — Encounter (HOSPITAL_COMMUNITY): Payer: Self-pay

## 2017-01-17 ENCOUNTER — Ambulatory Visit (HOSPITAL_BASED_OUTPATIENT_CLINIC_OR_DEPARTMENT_OTHER): Payer: 59 | Admitting: Oncology

## 2017-01-17 ENCOUNTER — Other Ambulatory Visit: Payer: 59

## 2017-01-17 VITALS — BP 133/45 | HR 53 | Temp 97.8°F | Resp 18 | Ht 66.0 in | Wt 183.5 lb

## 2017-01-17 DIAGNOSIS — M542 Cervicalgia: Secondary | ICD-10-CM | POA: Diagnosis not present

## 2017-01-17 DIAGNOSIS — D447 Neoplasm of uncertain behavior of aortic body and other paraganglia: Secondary | ICD-10-CM

## 2017-01-17 DIAGNOSIS — C755 Malignant neoplasm of aortic body and other paraganglia: Secondary | ICD-10-CM | POA: Diagnosis not present

## 2017-01-17 DIAGNOSIS — Z86018 Personal history of other benign neoplasm: Secondary | ICD-10-CM

## 2017-01-17 NOTE — Progress Notes (Signed)
[place] OFFICE PROGRESS NOTE   Diagnosis: Paraganglioma  INTERVAL HISTORY:   [name] returns for a scheduled visit. She feels well. Good appetite and energy level. She continues to have intermittent headaches and right-sided face/neck pain. The pain has improved since starting gabapentin. No change to the pattern of her headaches. The lab was unable to obtain peripheral blood today despite multiple attempts.  Objective:  Vital signs in last 24 hours:  Blood pressure (!) 133/45, pulse (!) 53, temperature 97.8 F (36.6 C), temperature source Oral, resp. rate 18, height '5\' 6"'  (1.676 m), weight 183 lb 8 oz (83.2 kg), SpO2 100 %.    HEENT: No sinus tenderness. Neck without mass. Lymphatics: No cervical, supraclavicular, or axillary nodes Resp: Lungs clear bilaterally Cardio: Regular rate and rhythm GI: No hepatosplenomegaly, no mass, nontender Vascular: No leg edema   Lab Results:  Lab Results  Component Value Date   WBC 4.2 05/12/2016   HGB 12.9 05/12/2016   HCT 38.4 05/12/2016   MCV 93.4 05/12/2016   PLT 150 05/12/2016   NEUTROABS 1.6 05/12/2016    CMP     Component Value Date/Time   NA 140 04/16/2016 0938   K 4.4 04/16/2016 0938   CL 108 01/15/2015 1902   CO2 23 04/16/2016 0938   GLUCOSE 84 04/16/2016 0938   BUN 14.2 04/16/2016 0938   CREATININE 1.1 04/16/2016 0938   CALCIUM 9.1 04/16/2016 0938   PROT 7.3 04/16/2016 0938   ALBUMIN 3.3 (L) 04/16/2016 0938   AST 52 (H) 04/16/2016 0938   ALT 39 04/16/2016 0938   ALKPHOS 207 (H) 04/16/2016 0938   BILITOT 0.42 04/16/2016 0938   GFRNONAA 57 (L) 01/15/2015 1902   GFRAA >60 01/15/2015 1902    24-hour urine VMA on 12/10/2016-3.2 Medications: I have reviewed the patient's current medications.  Assessment/Plan: 1. 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 Restaging CT scans and a bone scan at [facility] in March 2011 revealed no evidence for disease progression.  The serum metanephrine, catecholamine and Chromogranin A levels remained elevated on 10/22/2010. Levels were within normal range 06/10/2014. 2. Intermittent sharp pain at the right face and neck. Potentially related to nerve injury from the surgery/radiation of the skull base versus the skull base mass. The pain has improved with Neurontin. 3. History of pancytopenia secondary to MIBG therapy. 4. Chronic sinus drainage and headache status post sinus surgery by Dr. Vinetta Bergamo at [facility] in July 2009. This was followed by nasal endoscopy and debridement in September 2009. She underwent a repeat debridement on 10/22/2009. 5. History of hypertension secondary to the paraganglioma, improved 6. Status post Cesarean section delivery October 2007. 7. History of oral candidiasis. 8. Osteopenia on a bone density scan 10/13/2010 with a low estradiol level.  9. Status post gastric bypass surgery and cholecystectomy 06/15/2011. 10. Anemia-question related to the gastric bypass surgery with an element of malnutrition versus myelodysplasia. Diagnostic bone marrow biopsy 02/14/2013 with a slightly hypocellular marrow with a 46XX karyotype, abundant iron stores. Hemoglobin remains stable. 11. Left ear and neck pain 02/08/2014. She completed a course of amoxicillin with no improvement. Brain MRI 03/07/2014 showed stable residual enhancing tumor at the skull base compared to 2009. She was referred to ENT and diagnosed with "TMJ ". 12. Brain MRI and cervical spine MRI 01/23/2015. Unchanged appearance of the brain without evidence of acute/subacute infarct. No significant interval change in the appearance of the residual skull base tumor. Minimal cervical spondylosis without stenosis.  Disposition:  [name] appears unchanged. There is no clinical evidence for progression of the paraganglioma. We follow her clinically and obtain repeat serum/urine catecholamine levels only for new symptoms.  [name] will return for  an office visit in 9 months. She will contact us in the interim for new symptoms.  15 minutes were spent with the patient today. The majority of the time was used for counseling and coordination of care.  Donneta Romberg, MD  01/17/2017  5:23 PM

## 2017-01-21 ENCOUNTER — Telehealth: Payer: Self-pay | Admitting: Oncology

## 2017-01-21 NOTE — Telephone Encounter (Signed)
Called patient but no answer so mailed out appointment for next year

## 2017-02-02 ENCOUNTER — Other Ambulatory Visit: Payer: Self-pay | Admitting: Oncology

## 2017-02-09 ENCOUNTER — Other Ambulatory Visit: Payer: Self-pay | Admitting: *Deleted

## 2017-02-09 MED ORDER — GABAPENTIN 100 MG PO CAPS: 100.0000 mg | ORAL_CAPSULE | Freq: Two times a day (BID) | ORAL | 1 refills | Status: DC

## 2017-02-10 ENCOUNTER — Ambulatory Visit (INDEPENDENT_AMBULATORY_CARE_PROVIDER_SITE_OTHER): Payer: 59 | Admitting: Certified Nurse Midwife

## 2017-02-10 ENCOUNTER — Encounter: Payer: Self-pay | Admitting: Certified Nurse Midwife

## 2017-02-10 VITALS — BP 125/88 | HR 58 | Ht 66.0 in | Wt 181.0 lb

## 2017-02-10 DIAGNOSIS — N952 Postmenopausal atrophic vaginitis: Secondary | ICD-10-CM | POA: Diagnosis not present

## 2017-02-10 DIAGNOSIS — Z1389 Encounter for screening for other disorder: Secondary | ICD-10-CM | POA: Diagnosis not present

## 2017-02-10 DIAGNOSIS — Z01419 Encounter for gynecological examination (general) (routine) without abnormal findings: Secondary | ICD-10-CM | POA: Diagnosis not present

## 2017-02-10 DIAGNOSIS — N3000 Acute cystitis without hematuria: Secondary | ICD-10-CM

## 2017-02-10 LAB — POCT URINALYSIS DIPSTICK
BILIRUBIN UA: NEGATIVE
GLUCOSE UA: NEGATIVE
KETONES UA: NEGATIVE
Nitrite, UA: POSITIVE
Spec Grav, UA: 1.015 (ref 1.010–1.025)
Urobilinogen, UA: NEGATIVE E.U./dL — AB
pH, UA: 6 (ref 5.0–8.0)

## 2017-02-10 MED ORDER — NITROFURANTOIN MONOHYD MACRO 100 MG PO CAPS: 100.0000 mg | ORAL_CAPSULE | Freq: Two times a day (BID) | ORAL | 0 refills | Status: DC

## 2017-02-10 MED ORDER — PHENAZOPYRIDINE HCL 200 MG PO TABS: 200.0000 mg | ORAL_TABLET | Freq: Three times a day (TID) | ORAL | 0 refills | Status: DC | PRN

## 2017-02-10 MED ORDER — OSPEMIFENE 60 MG PO TABS: 1.0000 | ORAL_TABLET | Freq: Every day | ORAL | 12 refills | Status: DC

## 2017-02-10 NOTE — Progress Notes (Signed)
Subjective:        [name] is a 46 y.o. female here for a routine exam.  Current complaints: vaginal dryness, has not had a period for about 7 months, stopped OCPs.  Currently working.  Spouse is truck Geophysicist/field seismologist and gone a lot.   Post cancer patient, had port.  Goes to [facility] for monitoring, was recently there.  Gets HA from brain tumor.    Married, currently sexually active, denies pain with sexual intercorse.    Personal health questionnaire:  Is patient Ashkenazi Jewish, have a family history of breast and/or ovarian cancer: yes, mother & sister, 2 maternal aunts.  Mom deceased 37 BCA reoccurrence.   Is there a family history of uterine cancer diagnosed at age < 21, gastrointestinal cancer, urinary tract cancer, family member who is a Field seismologist syndrome-associated carrier: no Is the patient overweight and hypertensive, family history of diabetes, personal history of gestational diabetes, preeclampsia or PCOS: yes Is patient over 69, have PCOS,  family history of premature CHD under age 82, diabetes, smoke, have hypertension or peripheral artery disease:  no At any time, has a partner hit, kicked or otherwise hurt or frightened you?: no Over the past 2 weeks, have you felt down, depressed or hopeless?: no Over the past 2 weeks, have you felt little interest or pleasure in doing things?:no   Gynecologic History No LMP recorded. Patient is not currently having periods (Reason: Chemotherapy). Contraception: tubal ligation Last Pap: 12/19/15. Results were: normal Last mammogram: 11/04/16. Results were: normal  Obstetric History OB History  Gravida Para Term Preterm AB Living  5 3 3   2 3   SAB TAB Ectopic Multiple Live Births  1 1     3     # Outcome Date GA Lbr Len/2nd Weight Sex Delivery Anes PTL Lv  5 Term 03/24/06 [redacted]w[redacted]d  6 lb 15 oz (3.147 kg) F CS-LTranv EPI  LIV  4 SAB 2002        DEC  3 Term 12/25/91 [redacted]w[redacted]d  8 lb 2 oz (3.685 kg) F Vag-Spont None  LIV  2 Term 10/07/89 [redacted]w[redacted]d  8 lb  (3.629 kg) M Vag-Spont None  LIV  1 TAB         DEC      Past Medical History:  Diagnosis Date  . Blood transfusion    2003 , 2004   . Cancer [facility])    pheochromocytoma, paraganglioma  . Elbow injury    10/12 due to fall   . GERD (gastroesophageal reflux disease)   . Headache(784.0)   . Hypertension     Past Surgical History:  Procedure Laterality Date  . ADRENALECTOMY  2004   Pheochromocytoma  . BRAIN SURGERY  2004    Recurrent Pheochromocytoma, 2006- chemo amd rad tx   . CESAREAN SECTION    . CHOLECYSTECTOMY  06/15/2011   Procedure: LAPAROSCOPIC CHOLECYSTECTOMY WITH INTRAOPERATIVE CHOLANGIOGRAM;  Surgeon: Edward Jolly, MD;  Location: [facility] ORS;  Service: General;  Laterality: N/A;  . GASTRIC ROUX-EN-Y  06/15/2011   Procedure: LAPAROSCOPIC ROUX-EN-Y GASTRIC;  Surgeon: Edward Jolly, MD;  Location: [facility] ORS;  Service: General;  Laterality: N/A;  upper endoscopy  . OTHER SURGICAL HISTORY     removed scar tissue from uterus 2000  . TUBAL LIGATION  03/24/2006     Current Outpatient Prescriptions:  .  ferrous sulfate 324 (65 FE) MG TBEC, Take 1 tablet by mouth daily. Take 3 times a week, Disp: , Rfl:  .  gabapentin (NEURONTIN) 100 MG capsule, Take 1 capsule (100 mg total) by mouth 2 (two) times daily., Disp: 180 capsule, Rfl: 1 .  Multiple Vitamin (MULTIVITAMIN) tablet, Take 2 tablets by mouth daily. 2 chewable multivitamins daily, Disp: , Rfl:  .  omeprazole (PRILOSEC) 40 MG capsule, Take 40 mg by mouth daily. , Disp: , Rfl:  .  cyclobenzaprine (FLEXERIL) 5 MG tablet, Take 1 tablet (5 mg total) by mouth 2 (two) times daily as needed for muscle spasms. (Patient not taking: Reported on 02/10/2017), Disp: 30 tablet, Rfl: 1 .  diazepam (VALIUM) 5 MG tablet, Take 0.5 tablets (2.5 mg total) by mouth once. 1 HR PRIOR TO MRI appt. May repeat x 1 if needed. PATIENT WILL NEED A DRIVER TO AND FROM MRI (Patient not taking: Reported on 02/10/2017), Disp: 1 tablet, Rfl: 0 .  nitrofurantoin,  macrocrystal-monohydrate, (MACROBID) 100 MG capsule, Take 1 capsule (100 mg total) by mouth 2 (two) times daily., Disp: 14 capsule, Rfl: 0 .  Ospemifene (OSPHENA) 60 MG TABS, Take 1 tablet by mouth daily., Disp: 30 tablet, Rfl: 12 .  phenazopyridine (PYRIDIUM) 200 MG tablet, Take 1 tablet (200 mg total) by mouth 3 (three) times daily as needed for pain., Disp: 30 tablet, Rfl: 0 .  traMADol (ULTRAM) 50 MG tablet, TAKE 1 TABLET BY MOUTH EVERY 6 HOURS AS NEEDED (Patient not taking: Reported on 04/16/2016), Disp: 50 tablet, Rfl: 0 Allergies  Allergen Reactions  . Enalapril Swelling and Rash    Social History  Substance Use Topics  . Smoking status: Never Smoker  . Smokeless tobacco: Never Used  . Alcohol use Yes     Comment: occasional     Family History  Problem Relation Age of Onset  . Cancer Mother        breast  . Breast cancer Mother   . Cancer Maternal Aunt        breast  . Breast cancer Maternal Aunt       Review of Systems  Constitutional: negative for fatigue and weight loss Respiratory: negative for cough and wheezing Cardiovascular: negative for chest pain, fatigue and palpitations Gastrointestinal: negative for abdominal pain and change in bowel habits Musculoskeletal:negative for myalgias Neurological: negative for gait problems and tremors Behavioral/Psych: negative for abusive relationship, depression Endocrine: negative for temperature intolerance    Genitourinary:negative for abnormal menstrual periods, genital lesions, hot flashes, sexual problems and vaginal discharge Integument/breast: negative for breast lump, breast tenderness, nipple discharge and skin lesion(s)    Objective:       BP 125/88   Pulse (!) 58   Ht 5\' 6"  (1.676 m)   Wt 181 lb (82.1 kg)   BMI 29.21 kg/m  General:   alert  Skin:   no rash or abnormalities  Lungs:   clear to auscultation bilaterally  Heart:   regular rate and rhythm, S1, S2 normal, no murmur, click, rub or gallop   Breasts:   normal without suspicious masses, skin or nipple changes or axillary nodes  Abdomen:  normal findings: no organomegaly, soft, non-tender and no hernia obese  Pelvis:  External genitalia: normal general appearance Urinary system: urethral meatus normal and bladder without fullness, nontender Vaginal: normal without tenderness, induration or masses, absent rugae Cervix: normal appearance Adnexa: normal bimanual exam Uterus: anteverted and non-tender, normal size   Lab Review Urine pregnancy test Labs reviewed yes Radiologic studies reviewed yes  50% of 30 min visit spent on counseling and coordination of care.    Assessment &  Plan    Healthy female exam.    1. Acute cystitis without hematuria     - POCT urinalysis dipstick - Urine Culture - nitrofurantoin, macrocrystal-monohydrate, (MACROBID) 100 MG capsule; Take 1 capsule (100 mg total) by mouth 2 (two) times daily.  Dispense: 14 capsule; Refill: 0 - phenazopyridine (PYRIDIUM) 200 MG tablet; Take 1 tablet (200 mg total) by mouth 3 (three) times daily as needed for pain.  Dispense: 30 tablet; Refill: 0  2. Well woman exam with routine gynecological exam - Cytology - PAP - Cervicovaginal ancillary only  3. Perimenopausal atrophic vaginitis     OTC Replens samples given.  - Ospemifene (OSPHENA) 60 MG TABS; Take 1 tablet by mouth daily.  Dispense: 30 tablet; Refill: 12     Education reviewed: calcium supplements, depression evaluation, low fat, low cholesterol diet, safe sex/STD prevention, self breast exams, skin cancer screening and weight bearing exercise. Contraception: tubal ligation. Follow up in: 1 year.   Meds ordered this encounter  Medications  . nitrofurantoin, macrocrystal-monohydrate, (MACROBID) 100 MG capsule    Sig: Take 1 capsule (100 mg total) by mouth 2 (two) times daily.    Dispense:  14 capsule    Refill:  0  . phenazopyridine (PYRIDIUM) 200 MG tablet    Sig: Take 1 tablet (200 mg total) by  mouth 3 (three) times daily as needed for pain.    Dispense:  30 tablet    Refill:  0  . Ospemifene (OSPHENA) 60 MG TABS    Sig: Take 1 tablet by mouth daily.    Dispense:  30 tablet    Refill:  12   Orders Placed This Encounter  Procedures  . Urine Culture  . POCT urinalysis dipstick    Possible management options include:estring, HRT, blood LH/FSH ratio (declined today)

## 2017-02-11 LAB — CERVICOVAGINAL ANCILLARY ONLY
Bacterial vaginitis: POSITIVE — AB
CANDIDA VAGINITIS: NEGATIVE

## 2017-02-14 ENCOUNTER — Ambulatory Visit: Payer: 59 | Admitting: Certified Nurse Midwife

## 2017-02-14 LAB — URINE CULTURE

## 2017-02-15 LAB — CYTOLOGY - PAP
DIAGNOSIS: UNDETERMINED — AB
HPV (WINDOPATH): NOT DETECTED

## 2017-02-19 ENCOUNTER — Other Ambulatory Visit: Payer: Self-pay | Admitting: Certified Nurse Midwife

## 2017-02-19 DIAGNOSIS — N3000 Acute cystitis without hematuria: Secondary | ICD-10-CM

## 2017-02-19 MED ORDER — AMOXICILLIN-POT CLAVULANATE 875-125 MG PO TABS: 1.0000 | ORAL_TABLET | Freq: Two times a day (BID) | ORAL | 0 refills | Status: DC

## 2017-02-21 ENCOUNTER — Other Ambulatory Visit: Payer: Self-pay | Admitting: *Deleted

## 2017-02-21 DIAGNOSIS — N3000 Acute cystitis without hematuria: Secondary | ICD-10-CM

## 2017-02-21 MED ORDER — AMOXICILLIN-POT CLAVULANATE 875-125 MG PO TABS
1.0000 | ORAL_TABLET | Freq: Two times a day (BID) | ORAL | 0 refills | Status: DC
Start: 2017-02-21 — End: 2017-10-17

## 2017-03-09 ENCOUNTER — Other Ambulatory Visit: Payer: Self-pay | Admitting: Oncology

## 2017-03-09 DIAGNOSIS — M542 Cervicalgia: Secondary | ICD-10-CM

## 2017-03-11 ENCOUNTER — Other Ambulatory Visit: Payer: Self-pay | Admitting: Oncology

## 2017-03-14 ENCOUNTER — Telehealth: Payer: Self-pay

## 2017-03-14 DIAGNOSIS — M542 Cervicalgia: Secondary | ICD-10-CM

## 2017-03-14 MED ORDER — CYCLOBENZAPRINE HCL 5 MG PO TABS: 5.0000 mg | ORAL_TABLET | Freq: Two times a day (BID) | ORAL | 1 refills | Status: DC | PRN

## 2017-03-14 NOTE — Telephone Encounter (Signed)
Pt called and informed that her prescriptions she requested has been refilled. Pt appreciative of call back

## 2017-06-02 ENCOUNTER — Other Ambulatory Visit: Payer: Self-pay | Admitting: Oncology

## 2017-08-15 ENCOUNTER — Other Ambulatory Visit: Payer: Self-pay | Admitting: Nurse Practitioner

## 2017-10-04 ENCOUNTER — Other Ambulatory Visit: Payer: Self-pay | Admitting: Internal Medicine

## 2017-10-04 DIAGNOSIS — Z1231 Encounter for screening mammogram for malignant neoplasm of breast: Secondary | ICD-10-CM

## 2017-10-17 ENCOUNTER — Encounter: Payer: Self-pay | Admitting: Nurse Practitioner

## 2017-10-17 ENCOUNTER — Inpatient Hospital Stay: Payer: 59 | Attending: Nurse Practitioner | Admitting: Nurse Practitioner

## 2017-10-17 ENCOUNTER — Telehealth: Payer: Self-pay | Admitting: Nurse Practitioner

## 2017-10-17 VITALS — BP 103/52 | HR 63 | Temp 98.7°F | Resp 17 | Ht 66.0 in | Wt 185.0 lb

## 2017-10-17 DIAGNOSIS — C755 Malignant neoplasm of aortic body and other paraganglia: Secondary | ICD-10-CM | POA: Insufficient documentation

## 2017-10-17 DIAGNOSIS — D447 Neoplasm of uncertain behavior of aortic body and other paraganglia: Secondary | ICD-10-CM

## 2017-10-17 NOTE — Progress Notes (Addendum)
  [name] OFFICE PROGRESS NOTE   Diagnosis: Paraganglioma  INTERVAL HISTORY:   [name] returns as scheduled.  She overall feels well.  No change in baseline headaches.  She continues gabapentin.  No new areas of pain.  She has a good appetite.  Objective:  Vital signs in last 24 hours:  Blood pressure (!) 103/52, pulse 63, temperature 98.7 F (37.1 C), temperature source Oral, resp. rate 17, height '5\' 6"'$  (1.676 m), weight 185 lb (83.9 kg), SpO2 100 %.    HEENT: Neck without mass. Lymphatics: No palpable cervical, supraclavicular or axillary lymph nodes. Resp: Lungs clear bilaterally. Cardio: Regular rate and rhythm. GI: Abdomen soft and nontender.  No hepatomegaly. Vascular: No leg edema.  Lab Results:  Lab Results  Component Value Date   WBC 4.2 05/12/2016   HGB 12.9 05/12/2016   HCT 38.4 05/12/2016   MCV 93.4 05/12/2016   PLT 150 05/12/2016   NEUTROABS 1.6 05/12/2016    Imaging:  No results found.  Medications: I have reviewed the patient's current medications.  Assessment/Plan: 1. Metastatic paraganglioma status post radioactive MIBG therapy at [facility] 07/20/2006. Restaging CT scans and a bone scan at [facility] in March 2011 revealed no evidence for disease progression. The serum metanephrine, catecholamine and Chromogranin A levels remained elevated on 10/22/2010. Levels were within normal range 06/10/2014. 2. Intermittent sharp pain at the right face and neck. Potentially related to nerve injury from the surgery/radiation of the skull base versus the skull base mass. The pain has improved with Neurontin. 3. History of pancytopenia secondary to MIBG therapy. 4. Chronic sinus drainage and headache status post sinus surgery by Dr. Vinetta Bergamo at [facility] in July 2009. This was followed by nasal endoscopy and debridement in September 2009. She underwent a repeat debridement on 10/22/2009. 5. History of hypertension secondary to the paraganglioma,  improved 6. Status post Cesarean section delivery October 2007. 7. History of oral candidiasis. 8. Osteopenia on a bone density scan 10/13/2010 with a low estradiol level.  9. Status post gastric bypass surgery and cholecystectomy 06/15/2011. 10. Anemia-question related to the gastric bypass surgery with an element of malnutrition versus myelodysplasia. Diagnostic bone marrow biopsy 02/14/2013 with a slightly hypocellular marrow with a 46XX karyotype, abundant iron stores. Hemoglobin remains stable. 11. Left ear and neck pain 02/08/2014. She completed a course of amoxicillin with no improvement. Brain MRI 03/07/2014 showed stable residual enhancing tumor at the skull base compared to 2009. She was referred to ENT and diagnosed with "TMJ ". 12. Brain MRI and cervical spine MRI 01/23/2015. Unchanged appearance of the brain without evidence of acute/subacute infarct. No significant interval change in the appearance of the residual skull base tumor. Minimal cervical spondylosis without stenosis.     Disposition: [name] appears stable.  There is no clinical evidence for progression of the paraganglioma.  Plan to continue to follow with observation.  She will return for a follow-up visit in 9 months.  She will contact the office in the interim with any problems.  Patient seen with Dr. Benay Spice.   [name] ANP/GNP-BC   10/17/2017  3:07 PM This was a shared visit with [name].  There is no evidence for progression of the paraganglioma.  We will initiate diagnostic evaluation if she develops new symptoms.  Julieanne Manson, MD

## 2017-10-17 NOTE — Telephone Encounter (Signed)
Appointments scheduled AVS/Calendar printed per 5/13 los °

## 2017-10-27 ENCOUNTER — Other Ambulatory Visit: Payer: Self-pay | Admitting: Nurse Practitioner

## 2017-11-02 ENCOUNTER — Other Ambulatory Visit: Payer: Self-pay | Admitting: Emergency Medicine

## 2017-11-02 MED ORDER — GABAPENTIN 100 MG PO CAPS: ORAL_CAPSULE | ORAL | 1 refills | Status: DC

## 2017-11-07 ENCOUNTER — Ambulatory Visit: Payer: 59

## 2017-11-07 ENCOUNTER — Ambulatory Visit
Admission: RE | Admit: 2017-11-07 | Discharge: 2017-11-07 | Disposition: A | Discharge status: Home / Self Care | Payer: 59 | Source: Ambulatory Visit | Attending: Internal Medicine | Admitting: Internal Medicine

## 2017-11-07 DIAGNOSIS — Z1231 Encounter for screening mammogram for malignant neoplasm of breast: Secondary | ICD-10-CM

## 2018-02-02 ENCOUNTER — Other Ambulatory Visit: Payer: Self-pay | Admitting: Oncology

## 2018-02-02 DIAGNOSIS — M542 Cervicalgia: Secondary | ICD-10-CM

## 2018-03-22 ENCOUNTER — Other Ambulatory Visit: Payer: Self-pay | Admitting: Oncology

## 2018-07-17 ENCOUNTER — Inpatient Hospital Stay: Payer: Self-pay | Admitting: Oncology

## 2018-07-18 ENCOUNTER — Telehealth: Payer: Self-pay | Admitting: Oncology

## 2018-07-18 NOTE — Telephone Encounter (Signed)
R/s apt per 2/10 sch message - pt is aware of new appt date and time

## 2018-08-04 ENCOUNTER — Inpatient Hospital Stay: Payer: No Typology Code available for payment source | Attending: Oncology | Admitting: Oncology

## 2018-08-04 ENCOUNTER — Other Ambulatory Visit: Payer: Self-pay

## 2018-08-04 ENCOUNTER — Telehealth: Payer: Self-pay | Admitting: Oncology

## 2018-08-04 VITALS — BP 127/70 | HR 60 | Temp 97.4°F | Resp 19 | Wt 202.4 lb

## 2018-08-04 DIAGNOSIS — Z8589 Personal history of malignant neoplasm of other organs and systems: Secondary | ICD-10-CM | POA: Diagnosis present

## 2018-08-04 DIAGNOSIS — M8580 Other specified disorders of bone density and structure, unspecified site: Secondary | ICD-10-CM | POA: Insufficient documentation

## 2018-08-04 DIAGNOSIS — D447 Neoplasm of uncertain behavior of aortic body and other paraganglia: Secondary | ICD-10-CM

## 2018-08-04 NOTE — Telephone Encounter (Signed)
Scheduled appt per 2/28 los.

## 2018-08-04 NOTE — Progress Notes (Signed)
[facility] OFFICE PROGRESS NOTE   Diagnosis: Paraganglioma  INTERVAL HISTORY:   [name] returns as scheduled.  She feels well.  She reports marked improvement in the right-sided headache since beginning gabapentin.  She takes tramadol infrequently.  She continues to have sinus drainage, improved with a nasal inhalation preparation. No fever, night sweats, or tachycardia.  Objective:  Vital signs in last 24 hours:  Blood pressure 127/70, pulse 60, temperature (!) 97.4 F (36.3 C), temperature source Oral, resp. rate 19, weight 202 lb 6.4 oz (91.8 kg), SpO2 100 %.    HEENT: Neck without mass, no sinus tenderness Lymphatics: No cervical, supraclavicular, axillary, or inguinal nodes Resp: Lungs clear bilaterally Cardio: Regular rate and rhythm GI: No hepatosplenomegaly, no mass, nontender Vascular: No leg edema   Lab Results:  Lab Results  Component Value Date   WBC 4.2 05/12/2016   HGB 12.9 05/12/2016   HCT 38.4 05/12/2016   MCV 93.4 05/12/2016   PLT 150 05/12/2016   NEUTROABS 1.6 05/12/2016    CMP  Lab Results  Component Value Date   NA 140 04/16/2016   K 4.4 04/16/2016   CL 108 01/15/2015   CO2 23 04/16/2016   GLUCOSE 84 04/16/2016   BUN 14.2 04/16/2016   CREATININE 1.1 04/16/2016   CALCIUM 9.1 04/16/2016   PROT 7.3 04/16/2016   ALBUMIN 3.3 (L) 04/16/2016   AST 52 (H) 04/16/2016   ALT 39 04/16/2016   ALKPHOS 207 (H) 04/16/2016   BILITOT 0.42 04/16/2016   GFRNONAA 57 (L) 01/15/2015   GFRAA >60 01/15/2015    No results found for: CEA1  Lab Results  Component Value Date   INR 1.14 02/14/2013    Imaging:  No results found.  Medications: I have reviewed the patient's current medications.   Assessment/Plan: 1. Metastatic paraganglioma status post radioactive MIBG therapy at [facility] 07/20/2006. Restaging CT scans and a bone scan at [facility] in March 2011 revealed no evidence for disease progression. The serum metanephrine, catecholamine  and Chromogranin A levels remained elevated on 10/22/2010. Levels were within normal range 06/10/2014. 2. Intermittent sharp pain at the right face and neck. Potentially related to nerve injury from the surgery/radiation of the skull base versus the skull base mass. The pain has improved with Neurontin. 3. History of pancytopenia secondary to MIBG therapy. 4. Chronic sinus drainage and headache status post sinus surgery by Dr. Vinetta Bergamo at [facility] in July 2009. This was followed by nasal endoscopy and debridement in September 2009. She underwent a repeat debridement on 10/22/2009. 5. History of hypertension secondary to the paraganglioma, improved 6. Status post Cesarean section delivery October 2007. 7. History of oral candidiasis. 8. Osteopenia on a bone density scan 10/13/2010 with a low estradiol level.  9. Status post gastric bypass surgery and cholecystectomy 06/15/2011. 10. Anemia-question related to the gastric bypass surgery with an element of malnutrition versus myelodysplasia. Diagnostic bone marrow biopsy 02/14/2013 with a slightly hypocellular marrow with a 46XX karyotype, abundant iron stores. Hemoglobin remains stable. 11. Left ear and neck pain 02/08/2014. She completed a course of amoxicillin with no improvement. Brain MRI 03/07/2014 showed stable residual enhancing tumor at the skull base compared to 2009. She was referred to ENT and diagnosed with "TMJ ". 12. Brain MRI and cervical spine MRI 01/23/2015. Unchanged appearance of the brain without evidence of acute/subacute infarct. No significant interval change in the appearance of the residual skull base tumor. Minimal cervical spondylosis without stenosis.   Disposition: [name] appears  well.  There is no clinical evidence for progression of the paraganglioma.  She will contact us for new symptoms.  We will follow her clinical status as opposed to repeating serum/urine catecholamine levels.  She will return for an office visit  in 9 months.  Betsy Coder, MD  08/04/2018  9:41 AM

## 2018-08-28 ENCOUNTER — Other Ambulatory Visit: Payer: Self-pay | Admitting: Oncology

## 2018-08-28 DIAGNOSIS — M542 Cervicalgia: Secondary | ICD-10-CM

## 2018-10-04 ENCOUNTER — Other Ambulatory Visit: Payer: Self-pay | Admitting: Internal Medicine

## 2018-10-04 DIAGNOSIS — Z1231 Encounter for screening mammogram for malignant neoplasm of breast: Secondary | ICD-10-CM

## 2018-11-29 ENCOUNTER — Ambulatory Visit: Payer: 59

## 2019-01-10 ENCOUNTER — Ambulatory Visit
Admission: RE | Admit: 2019-01-10 | Discharge: 2019-01-10 | Disposition: A | Discharge status: Home / Self Care | Payer: No Typology Code available for payment source | Source: Ambulatory Visit | Attending: Internal Medicine | Admitting: Internal Medicine

## 2019-01-10 ENCOUNTER — Other Ambulatory Visit: Payer: Self-pay

## 2019-01-10 DIAGNOSIS — Z1231 Encounter for screening mammogram for malignant neoplasm of breast: Secondary | ICD-10-CM

## 2019-02-19 ENCOUNTER — Other Ambulatory Visit: Payer: Self-pay | Admitting: Internal Medicine

## 2019-02-19 DIAGNOSIS — M8580 Other specified disorders of bone density and structure, unspecified site: Secondary | ICD-10-CM

## 2019-02-22 ENCOUNTER — Other Ambulatory Visit: Payer: Self-pay | Admitting: Internal Medicine

## 2019-02-22 DIAGNOSIS — N289 Disorder of kidney and ureter, unspecified: Secondary | ICD-10-CM

## 2019-02-28 ENCOUNTER — Ambulatory Visit
Admission: RE | Admit: 2019-02-28 | Discharge: 2019-02-28 | Disposition: A | Discharge status: Home / Self Care | Payer: No Typology Code available for payment source | Source: Ambulatory Visit | Attending: Internal Medicine | Admitting: Internal Medicine

## 2019-02-28 DIAGNOSIS — N289 Disorder of kidney and ureter, unspecified: Secondary | ICD-10-CM

## 2019-05-07 ENCOUNTER — Other Ambulatory Visit: Payer: Self-pay

## 2019-05-07 ENCOUNTER — Telehealth: Payer: Self-pay | Admitting: Nurse Practitioner

## 2019-05-07 ENCOUNTER — Inpatient Hospital Stay: Payer: No Typology Code available for payment source | Attending: Nurse Practitioner | Admitting: Nurse Practitioner

## 2019-05-07 ENCOUNTER — Ambulatory Visit: Payer: No Typology Code available for payment source

## 2019-05-07 ENCOUNTER — Encounter: Payer: Self-pay | Admitting: Nurse Practitioner

## 2019-05-07 VITALS — BP 130/81 | HR 62 | Temp 97.8°F | Resp 17 | Ht 66.0 in | Wt 209.3 lb

## 2019-05-07 DIAGNOSIS — M8580 Other specified disorders of bone density and structure, unspecified site: Secondary | ICD-10-CM | POA: Diagnosis present

## 2019-05-07 DIAGNOSIS — D447 Neoplasm of uncertain behavior of aortic body and other paraganglia: Secondary | ICD-10-CM | POA: Diagnosis not present

## 2019-05-07 NOTE — Progress Notes (Signed)
  [name] OFFICE PROGRESS NOTE   Diagnosis: Paraganglioma  INTERVAL HISTORY:   [name] returns as scheduled.  No change in headaches and sinus drainage.  No fever or shortness of breath.  Her husband died unexpectedly of a heart attack several weeks ago.  She is having some anxiety.  Objective:  Vital signs in last 24 hours:  Blood pressure 130/81, pulse 62, temperature 97.8 F (36.6 C), temperature source Temporal, resp. rate 17, height '5\' 6"'$  (1.676 m), weight 209 lb 4.8 oz (94.9 kg), SpO2 100 %.    HEENT: Neck without mass. Lymphatics: No palpable cervical, supraclavicular or axillary lymph nodes. GI: Abdomen soft and nontender.  No hepatosplenomegaly.  No mass. Vascular: No leg edema. Neuro: Alert and oriented.   Lab Results:  Lab Results  Component Value Date   WBC 4.2 05/12/2016   HGB 12.9 05/12/2016   HCT 38.4 05/12/2016   MCV 93.4 05/12/2016   PLT 150 05/12/2016   NEUTROABS 1.6 05/12/2016    Imaging:  No results found.  Medications: I have reviewed the patient's current medications.  Assessment/Plan: 1. Metastatic paraganglioma status post radioactive MIBG therapy at [facility] 07/20/2006. Restaging CT scans and a bone scan at [facility] in March 2011 revealed no evidence for disease progression. The serum metanephrine, catecholamine and Chromogranin A levels remained elevated on 10/22/2010. Levels were within normal range 06/10/2014. 2. Intermittent sharp pain at the right face and neck. Potentially related to nerve injury from the surgery/radiation of the skull base versus the skull base mass. The pain has improved with Neurontin. 3. History of pancytopenia secondary to MIBG therapy. 4. Chronic sinus drainage and headache status post sinus surgery by Dr. Vinetta Bergamo at [facility] in July 2009. This was followed by nasal endoscopy and debridement in September 2009. She underwent a repeat debridement on 10/22/2009. 5. History of hypertension secondary to the  paraganglioma, improved 6. Status post Cesarean section delivery October 2007. 7. History of oral candidiasis. 8. Osteopenia on a bone density scan 10/13/2010 with a low estradiol level.  9. Status post gastric bypass surgery and cholecystectomy 06/15/2011. 10. Anemia-question related to the gastric bypass surgery with an element of malnutrition versus myelodysplasia. Diagnostic bone marrow biopsy 02/14/2013 with a slightly hypocellular marrow with a 46XX karyotype, abundant iron stores. Hemoglobin remains stable. 11. Left ear and neck pain 02/08/2014. She completed a course of amoxicillin with no improvement. Brain MRI 03/07/2014 showed stable residual enhancing tumor at the skull base compared to 2009. She was referred to ENT and diagnosed with "TMJ ". 12. Brain MRI and cervical spine MRI 01/23/2015. Unchanged appearance of the brain without evidence of acute/subacute infarct. No significant interval change in the appearance of the residual skull base tumor. Minimal cervical spondylosis without stenosis.  Disposition: [name] appears stable.  There is no clinical evidence for progression of the paraganglioma.  Plan to continue to follow with observation.  She will return for an office visit in 9 months.  She will contact the office in the interim with any problems.    [name] ANP/GNP-BC   05/07/2019  10:29 AM

## 2019-05-07 NOTE — Telephone Encounter (Signed)
Scheduled per los. Called and left msg. Mailed printout  °

## 2019-05-11 ENCOUNTER — Ambulatory Visit
Admission: RE | Admit: 2019-05-11 | Discharge: 2019-05-11 | Disposition: A | Discharge status: Home / Self Care | Payer: No Typology Code available for payment source | Source: Ambulatory Visit | Attending: Internal Medicine | Admitting: Internal Medicine

## 2019-05-11 ENCOUNTER — Other Ambulatory Visit: Payer: Self-pay

## 2019-05-11 DIAGNOSIS — M8580 Other specified disorders of bone density and structure, unspecified site: Secondary | ICD-10-CM

## 2019-05-24 ENCOUNTER — Other Ambulatory Visit: Payer: Self-pay | Admitting: Oncology

## 2019-05-24 DIAGNOSIS — M542 Cervicalgia: Secondary | ICD-10-CM

## 2019-09-05 ENCOUNTER — Other Ambulatory Visit: Payer: Self-pay | Admitting: Internal Medicine

## 2019-09-05 DIAGNOSIS — R109 Unspecified abdominal pain: Secondary | ICD-10-CM

## 2019-09-12 ENCOUNTER — Other Ambulatory Visit: Payer: No Typology Code available for payment source

## 2019-09-12 ENCOUNTER — Ambulatory Visit
Admission: RE | Admit: 2019-09-12 | Discharge: 2019-09-12 | Disposition: A | Discharge status: Home / Self Care | Payer: No Typology Code available for payment source | Source: Ambulatory Visit | Attending: Internal Medicine | Admitting: Internal Medicine

## 2019-09-12 DIAGNOSIS — R109 Unspecified abdominal pain: Secondary | ICD-10-CM

## 2019-09-19 ENCOUNTER — Other Ambulatory Visit: Payer: Self-pay | Admitting: Internal Medicine

## 2019-09-19 DIAGNOSIS — Z923 Personal history of irradiation: Secondary | ICD-10-CM

## 2019-09-19 DIAGNOSIS — R932 Abnormal findings on diagnostic imaging of liver and biliary tract: Secondary | ICD-10-CM

## 2019-09-19 DIAGNOSIS — R748 Abnormal levels of other serum enzymes: Secondary | ICD-10-CM

## 2019-09-19 DIAGNOSIS — Z86018 Personal history of other benign neoplasm: Secondary | ICD-10-CM

## 2019-10-09 IMAGING — US US RENAL
1 series · 14 of 25 positions shown · non-contrast
Comparison: None.

CLINICAL DATA: Renal dysfunction.  Hypertension.

EXAM:
RENAL / URINARY TRACT ULTRASOUND COMPLETE

[Series 1: us renal · 0.17mm/px · 14 of 34 slices shown]
[im 1/34]
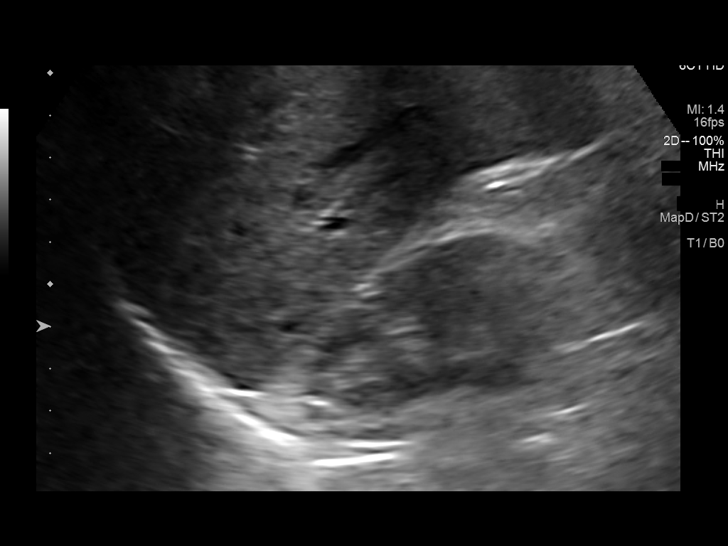
[im 3/34]
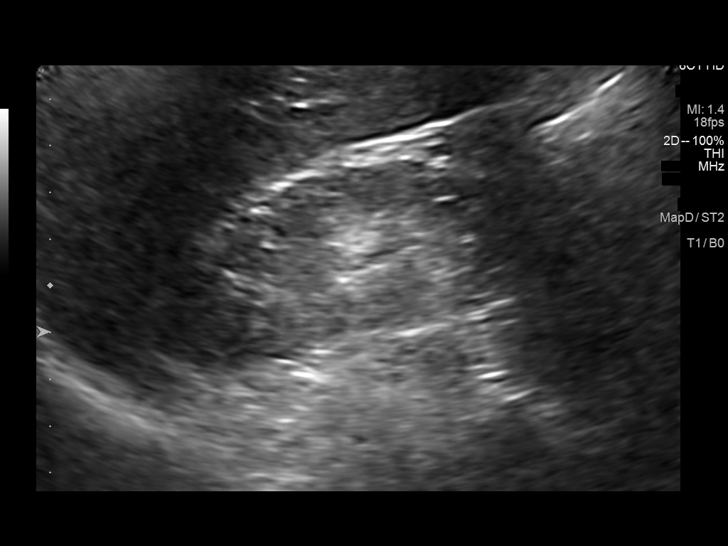
[im 6/34]
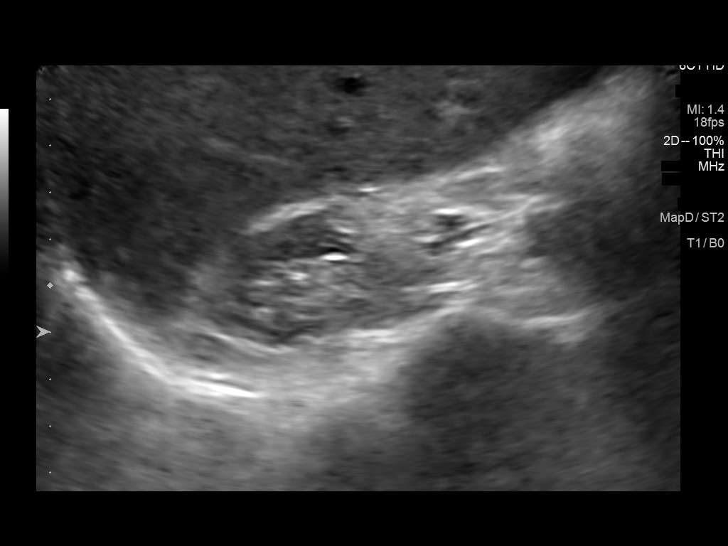
[im 9/34]
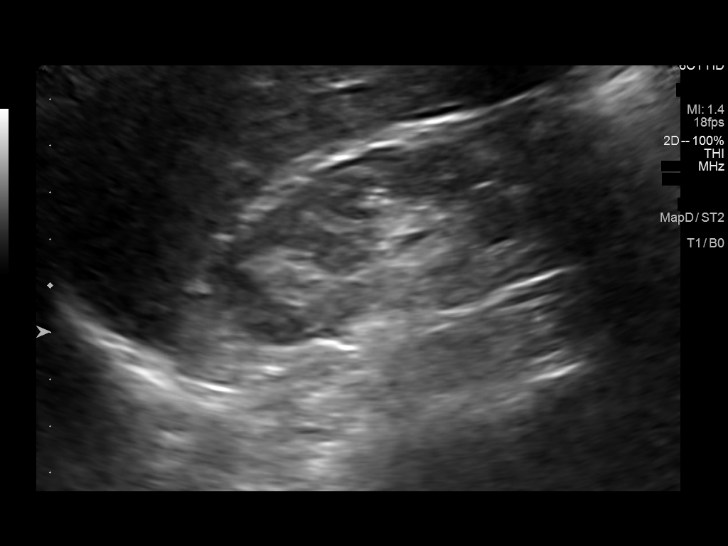
[im 12/34]
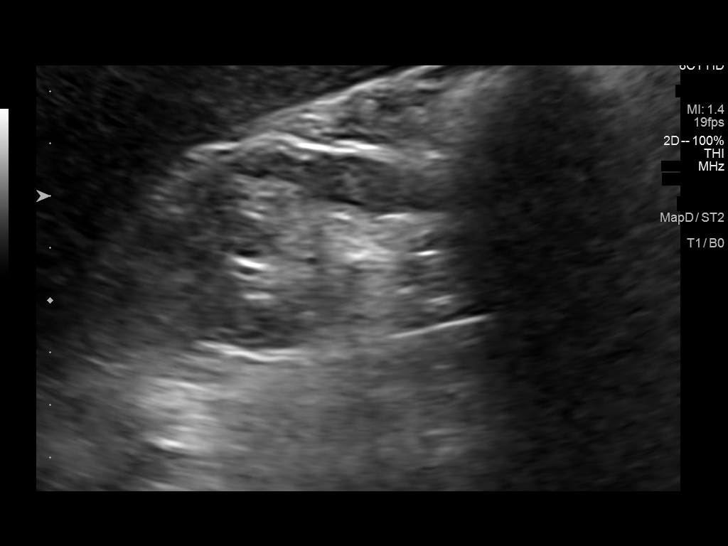
[im 13/34]
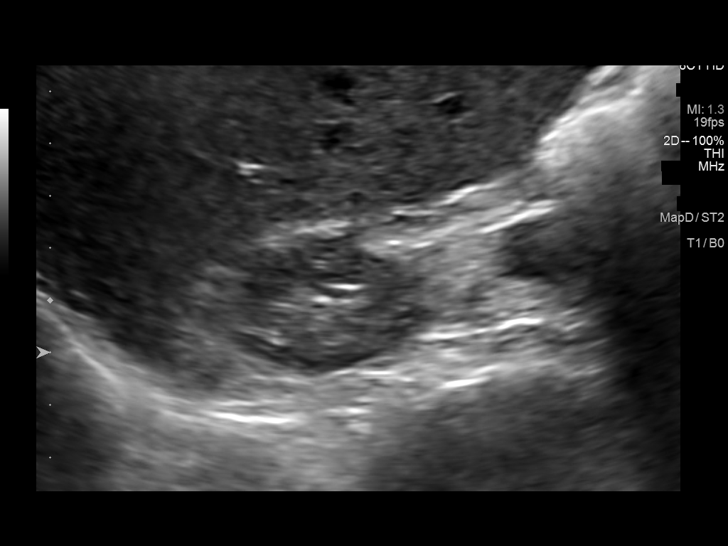
[im 16/34]
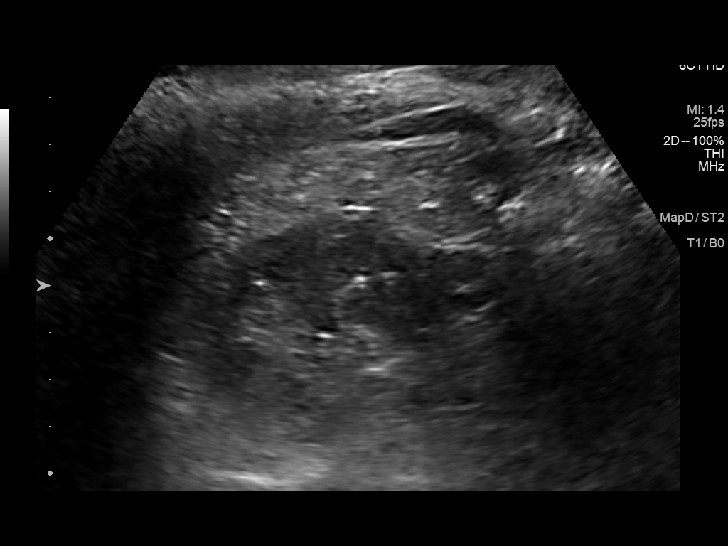
[im 18/34]
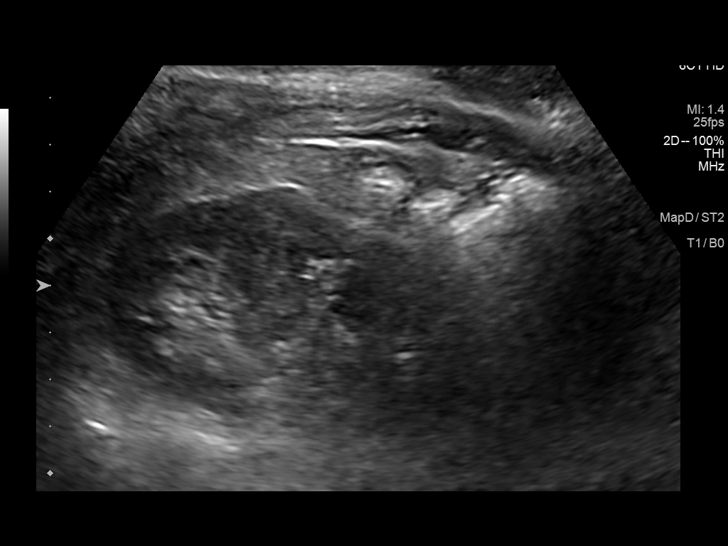
[im 21/34]
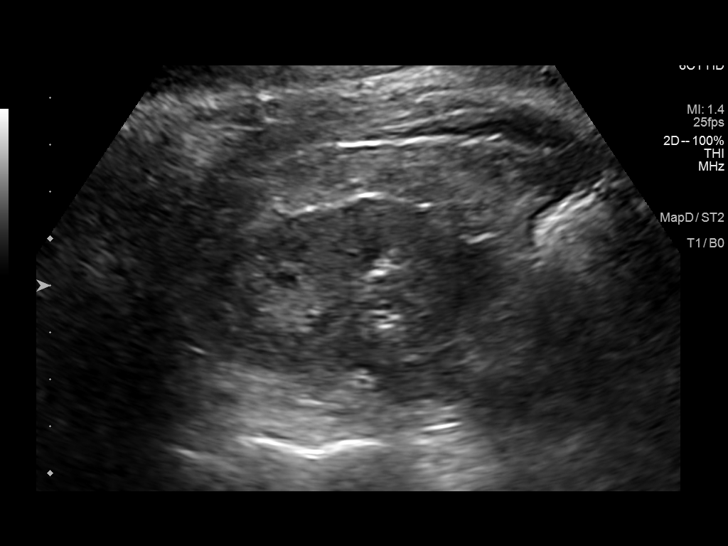
[im 23/34]
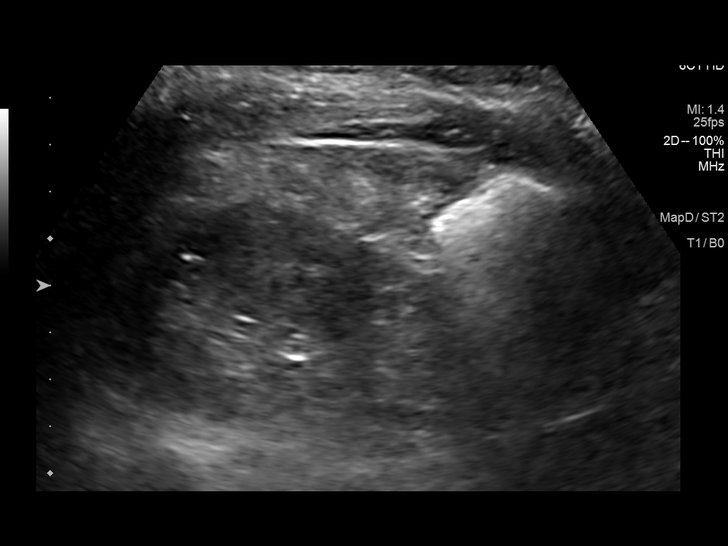
[im 25/34]
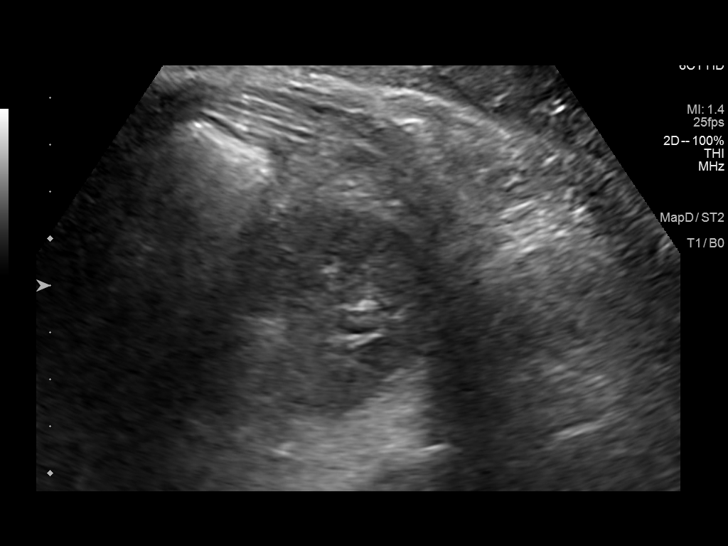
[im 28/34]
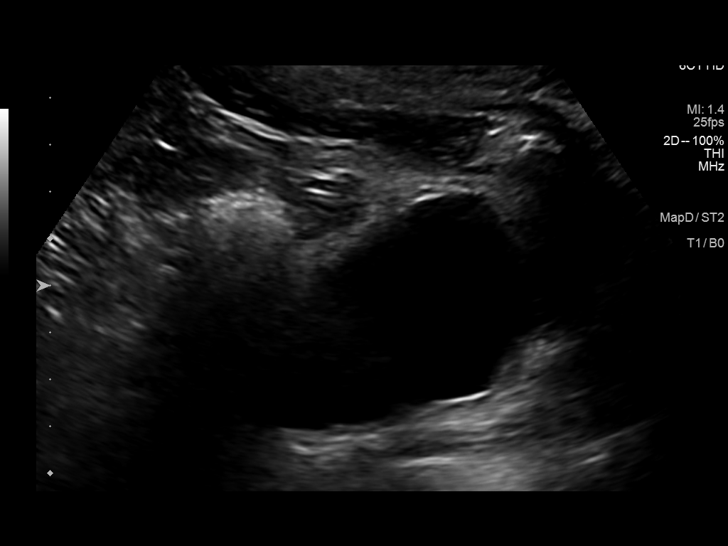
[im 31/34]
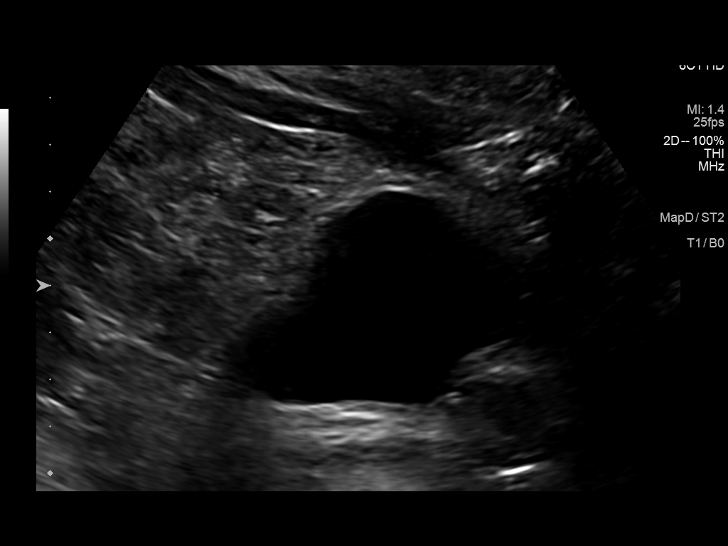
[im 34/34]
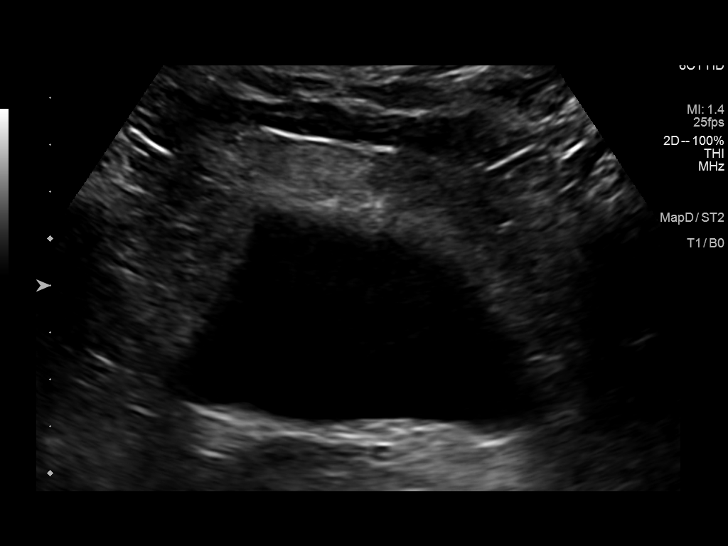

[14 of 25 positions shown; findings below may reference images not displayed]

FINDINGS: Right Kidney:

Renal measurements: 8.5 x 3.6 x 3.5 cm = volume: 56.7 mL .
Echogenicity and renal cortical thickness are within normal limits.
No mass, perinephric fluid, or hydronephrosis visualized. No
sonographically demonstrable calculus or ureterectasis.

Left Kidney:

Renal measurements: 8.4 x 4.9 x 4.7 cm = volume: 101.6 mL.
Echogenicity and renal cortical thickness are within normal limits.
No mass, perinephric fluid, or hydronephrosis visualized. No
sonographically demonstrable calculus or ureterectasis.

Bladder:

Appears normal for degree of bladder distention.
IMPRESSION: Kidneys bilaterally a rather small, a finding that may be indicative
of medical renal disease. The renal echogenicity and cortical
thickness are within normal limits. No obstructing focus in either
kidney.

## 2019-10-15 ENCOUNTER — Ambulatory Visit
Admission: RE | Admit: 2019-10-15 | Discharge: 2019-10-15 | Disposition: A | Discharge status: Home / Self Care | Payer: No Typology Code available for payment source | Source: Ambulatory Visit | Attending: Internal Medicine | Admitting: Internal Medicine

## 2019-10-15 ENCOUNTER — Other Ambulatory Visit: Payer: Self-pay

## 2019-10-15 DIAGNOSIS — R748 Abnormal levels of other serum enzymes: Secondary | ICD-10-CM

## 2019-10-15 DIAGNOSIS — Z923 Personal history of irradiation: Secondary | ICD-10-CM

## 2019-10-15 DIAGNOSIS — Z86018 Personal history of other benign neoplasm: Secondary | ICD-10-CM

## 2019-10-15 DIAGNOSIS — R932 Abnormal findings on diagnostic imaging of liver and biliary tract: Secondary | ICD-10-CM

## 2019-10-15 MED ORDER — GADOBENATE DIMEGLUMINE 529 MG/ML IV SOLN
20.0000 mL | Freq: Once | INTRAVENOUS | Status: AC | PRN
  Administered 2019-10-15: 20 mL via INTRAVENOUS

## 2019-12-20 ENCOUNTER — Other Ambulatory Visit: Payer: Self-pay | Admitting: Internal Medicine

## 2019-12-20 DIAGNOSIS — Z1231 Encounter for screening mammogram for malignant neoplasm of breast: Secondary | ICD-10-CM

## 2020-01-11 ENCOUNTER — Ambulatory Visit
Admission: RE | Admit: 2020-01-11 | Discharge: 2020-01-11 | Disposition: A | Discharge status: Home / Self Care | Payer: No Typology Code available for payment source | Source: Ambulatory Visit | Attending: Internal Medicine | Admitting: Internal Medicine

## 2020-01-11 ENCOUNTER — Other Ambulatory Visit: Payer: Self-pay

## 2020-01-11 DIAGNOSIS — Z1231 Encounter for screening mammogram for malignant neoplasm of breast: Secondary | ICD-10-CM

## 2020-02-04 ENCOUNTER — Inpatient Hospital Stay: Payer: No Typology Code available for payment source | Admitting: Oncology

## 2020-02-11 ENCOUNTER — Other Ambulatory Visit: Payer: Self-pay | Admitting: Oncology

## 2020-02-11 DIAGNOSIS — M542 Cervicalgia: Secondary | ICD-10-CM

## 2020-02-19 ENCOUNTER — Inpatient Hospital Stay: Payer: No Typology Code available for payment source | Attending: Oncology | Admitting: Oncology

## 2020-02-19 ENCOUNTER — Telehealth: Payer: Self-pay | Admitting: Oncology

## 2020-02-19 ENCOUNTER — Other Ambulatory Visit: Payer: Self-pay

## 2020-02-19 VITALS — BP 142/95 | HR 54 | Temp 97.6°F | Resp 18 | Ht 66.0 in | Wt 220.5 lb

## 2020-02-19 DIAGNOSIS — D649 Anemia, unspecified: Secondary | ICD-10-CM | POA: Diagnosis not present

## 2020-02-19 DIAGNOSIS — I158 Other secondary hypertension: Secondary | ICD-10-CM | POA: Insufficient documentation

## 2020-02-19 DIAGNOSIS — Z9049 Acquired absence of other specified parts of digestive tract: Secondary | ICD-10-CM | POA: Diagnosis not present

## 2020-02-19 DIAGNOSIS — Z9884 Bariatric surgery status: Secondary | ICD-10-CM | POA: Insufficient documentation

## 2020-02-19 DIAGNOSIS — M8580 Other specified disorders of bone density and structure, unspecified site: Secondary | ICD-10-CM | POA: Diagnosis not present

## 2020-02-19 DIAGNOSIS — Z923 Personal history of irradiation: Secondary | ICD-10-CM | POA: Insufficient documentation

## 2020-02-19 DIAGNOSIS — D447 Neoplasm of uncertain behavior of aortic body and other paraganglia: Secondary | ICD-10-CM

## 2020-02-19 DIAGNOSIS — C755 Malignant neoplasm of aortic body and other paraganglia: Secondary | ICD-10-CM | POA: Insufficient documentation

## 2020-02-19 NOTE — Progress Notes (Signed)
[place] OFFICE PROGRESS NOTE   Diagnosis: Paraganglioma  INTERVAL HISTORY:   [name] was last seen at the cancer center on 05/07/2019.  She has intermittent pain at the right side of the head and neck, but the pain is less intense than in the past.  No fever or night sweats.  No new sites of pain.  Objective:  Vital signs in last 24 hours:  Blood pressure (!) 142/95, pulse (!) 106, temperature 97.6 F (36.4 C), temperature source Tympanic, resp. rate 18, height '5\' 6"'  (1.676 m), weight 220 lb 8 oz (100 kg), SpO2 98 %.    HEENT: Neck without mass Lymphatics: No cervical, supraclavicular, axillary, or inguinal nodes Resp: Lungs clear bilaterally Cardio: Regular rate and rhythm GI: No mass, no hepatosplenomegaly, nontender Vascular: No leg edema   Lab Results:  Lab Results  Component Value Date   WBC 4.2 05/12/2016   HGB 12.9 05/12/2016   HCT 38.4 05/12/2016   MCV 93.4 05/12/2016   PLT 150 05/12/2016   NEUTROABS 1.6 05/12/2016    CMP  Lab Results  Component Value Date   NA 140 04/16/2016   K 4.4 04/16/2016   CL 108 01/15/2015   CO2 23 04/16/2016   GLUCOSE 84 04/16/2016   BUN 14.2 04/16/2016   CREATININE 1.1 04/16/2016   CALCIUM 9.1 04/16/2016   PROT 7.3 04/16/2016   ALBUMIN 3.3 (L) 04/16/2016   AST 52 (H) 04/16/2016   ALT 39 04/16/2016   ALKPHOS 207 (H) 04/16/2016   BILITOT 0.42 04/16/2016   GFRNONAA 57 (L) 01/15/2015   GFRAA >60 01/15/2015    Medications: I have reviewed the patient's current medications.   Assessment/Plan: 1. 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 Restaging CT scans and a bone scan at [facility] in March 2011 revealed no evidence for disease progression. The serum metanephrine, catecholamine and Chromogranin A levels remained elevated on 10/22/2010. Levels were within normal range 06/10/2014. 2. Intermittent sharp pain at the right face and neck. Potentially related to nerve  injury from the surgery/radiation of the skull base versus the skull base mass. The pain has improved with Neurontin. 3. History of pancytopenia secondary to MIBG therapy. 4. Chronic sinus drainage and headache status post sinus surgery by Dr. Vinetta Bergamo at [facility] in July 2009. This was followed by nasal endoscopy and debridement in September 2009. She underwent a repeat debridement on 10/22/2009. 5. History of hypertension secondary to the paraganglioma, improved 6. Status post Cesarean section delivery October 2007. 7. History of oral candidiasis. 8. Osteopenia on a bone density scan 10/13/2010 with a low estradiol level.  9. Status post gastric bypass surgery and cholecystectomy 06/15/2011. 10. Anemia-question related to the gastric bypass surgery with an element of malnutrition versus myelodysplasia. Diagnostic bone marrow biopsy 02/14/2013 with a slightly hypocellular marrow with a 46XX karyotype, abundant iron stores. Hemoglobin remains stable. 11. Left ear and neck pain 02/08/2014. She completed a course of amoxicillin with no improvement. Brain MRI 03/07/2014 showed stable residual enhancing tumor at the skull base compared to 2009. She was referred to ENT and diagnosed with "TMJ ". 12. Brain MRI and cervical spine MRI 01/23/2015. Unchanged appearance of the brain without evidence of acute/subacute infarct. No significant interval change in the appearance of the residual skull base tumor. Minimal cervical spondylosis without stenosis.    Disposition: Ms. Tensley is in clinical remission from the paraganglioma.  We decided against obtaining metanephrine and restaging CTs levels unless she develops new  symptoms.  She will return for an office visit in 9 months.  Betsy Coder, MD  02/19/2020  9:35 AM

## 2020-02-19 NOTE — Telephone Encounter (Signed)
Scheduled appointment per 9/14 los. Patient is aware of appointment. Patient declined calendar print out.

## 2020-11-18 ENCOUNTER — Ambulatory Visit: Payer: No Typology Code available for payment source | Admitting: Nurse Practitioner

## 2020-11-18 ENCOUNTER — Other Ambulatory Visit: Payer: Self-pay | Admitting: Internal Medicine

## 2020-11-18 DIAGNOSIS — Z1231 Encounter for screening mammogram for malignant neoplasm of breast: Secondary | ICD-10-CM

## 2020-11-21 ENCOUNTER — Other Ambulatory Visit: Payer: Self-pay

## 2020-11-21 ENCOUNTER — Encounter: Payer: Self-pay | Admitting: Nurse Practitioner

## 2020-11-21 ENCOUNTER — Inpatient Hospital Stay: Payer: No Typology Code available for payment source | Attending: Nurse Practitioner | Admitting: Nurse Practitioner

## 2020-11-21 DIAGNOSIS — Z9049 Acquired absence of other specified parts of digestive tract: Secondary | ICD-10-CM | POA: Insufficient documentation

## 2020-11-21 DIAGNOSIS — D649 Anemia, unspecified: Secondary | ICD-10-CM | POA: Diagnosis not present

## 2020-11-21 DIAGNOSIS — Z79899 Other long term (current) drug therapy: Secondary | ICD-10-CM | POA: Insufficient documentation

## 2020-11-21 DIAGNOSIS — I158 Other secondary hypertension: Secondary | ICD-10-CM | POA: Diagnosis not present

## 2020-11-21 DIAGNOSIS — C755 Malignant neoplasm of aortic body and other paraganglia: Secondary | ICD-10-CM | POA: Diagnosis not present

## 2020-11-21 DIAGNOSIS — Z9884 Bariatric surgery status: Secondary | ICD-10-CM | POA: Diagnosis not present

## 2020-11-21 DIAGNOSIS — Z923 Personal history of irradiation: Secondary | ICD-10-CM | POA: Diagnosis not present

## 2020-11-21 DIAGNOSIS — M542 Cervicalgia: Secondary | ICD-10-CM | POA: Diagnosis not present

## 2020-11-21 MED ORDER — CYCLOBENZAPRINE HCL 5 MG PO TABS
ORAL_TABLET | ORAL | 0 refills | Status: AC
Start: 2020-11-21 — End: ?

## 2020-11-21 MED ORDER — CYCLOBENZAPRINE HCL 5 MG PO TABS: ORAL_TABLET | ORAL | 0 refills | Status: DC

## 2020-11-21 NOTE — Progress Notes (Signed)
[place] OFFICE PROGRESS NOTE   Diagnosis: Paraganglioma  INTERVAL HISTORY:   [name] returns as scheduled.  In general she feels well.  She continues to have intermittent pain at the right head/neck.  The pain is no longer "sharp".  She has discontinued gabapentin.  She takes Flexeril if needed.  No fevers or sweats.  No nausea or vomiting.  Bowels regular.  No abdominal pain.  She has noted intermittent ankle edema since November 2021, left greater than right.  The edema is worse with prolonged sitting at her desk at work and tends to improve over the weekends.  Objective:  Vital signs in last 24 hours:  Blood pressure (!) 159/84, pulse 63, temperature 97.8 F (36.6 C), temperature source Oral, resp. rate 20, height _0  (1.676 m), weight 238 lb 3.2 oz (108 kg), SpO2 100 %.    HEENT: Neck without mass. Lymphatics: No palpable cervical, supraclavicular or axillary lymph nodes. Resp: Lungs clear bilaterally. Cardio: Regular rate and rhythm. GI: Abdomen soft and nontender.  No hepatosplenomegaly.  No mass. Vascular: Trace edema lower leg/ankle bilaterally.   Lab Results:  Lab Results  Component Value Date   WBC 4.2 05/12/2016   HGB 12.9 05/12/2016   HCT 38.4 05/12/2016   MCV 93.4 05/12/2016   PLT 150 05/12/2016   NEUTROABS 1.6 05/12/2016    Imaging:  No results found.  Medications: I have reviewed the patient's current medications.  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 Restaging CT scans and a bone scan at [facility] in March 2011 revealed no evidence for disease progression. The serum metanephrine, catecholamine and Chromogranin A levels remained elevated on 10/22/2010.  Levels were within normal range 06/10/2014. Intermittent sharp pain at the right face and neck. Potentially related to nerve injury from the surgery/radiation of the skull base versus the skull base mass. The pain has improved  with Neurontin. History of pancytopenia secondary to MIBG therapy. Chronic sinus drainage and headache status post sinus surgery by Dr. Vinetta Bergamo at [facility] in July 2009. This was followed by nasal endoscopy and debridement in September 2009. She underwent a repeat debridement on 10/22/2009. History of hypertension secondary to the paraganglioma, improved Status post Cesarean section delivery October 2007. History of oral candidiasis. Osteopenia on a bone density scan 10/13/2010 with a low estradiol level.   Status post gastric bypass surgery and cholecystectomy 06/15/2011. Anemia-question related to the gastric bypass surgery with an element of malnutrition versus myelodysplasia. Diagnostic bone marrow biopsy 02/14/2013 with a slightly hypocellular marrow with a 46XX karyotype, abundant iron stores. Hemoglobin remains stable. Left ear and neck pain 02/08/2014. She completed a course of amoxicillin with no improvement. Brain MRI 03/07/2014 showed stable residual enhancing tumor at the skull base compared to 2009. She was referred to ENT and diagnosed with "TMJ ". Brain MRI and cervical spine MRI 01/23/2015. Unchanged appearance of the brain without evidence of acute/subacute infarct. No significant interval change in the appearance of the residual skull base tumor. Minimal cervical spondylosis without stenosis.  Disposition: Ms. Dearman remains in clinical remission from the paraganglioma.  Plan to continue to follow with observation.  She will return for an office visit in 9 months.  She understands we are available to see her sooner if she develops any new symptoms/problems.  A refill for Flexeril was sent to her pharmacy.    [name] ANP/GNP-BC   11/21/2020  2:07 PM

## 2021-01-12 ENCOUNTER — Ambulatory Visit
Admission: RE | Admit: 2021-01-12 | Discharge: 2021-01-12 | Disposition: A | Discharge status: Home / Self Care | Payer: No Typology Code available for payment source | Source: Ambulatory Visit | Attending: Internal Medicine | Admitting: Internal Medicine

## 2021-01-12 ENCOUNTER — Other Ambulatory Visit: Payer: Self-pay

## 2021-01-12 DIAGNOSIS — Z1231 Encounter for screening mammogram for malignant neoplasm of breast: Secondary | ICD-10-CM

## 2021-05-11 ENCOUNTER — Other Ambulatory Visit: Payer: Self-pay | Admitting: Internal Medicine

## 2021-05-11 DIAGNOSIS — M810 Age-related osteoporosis without current pathological fracture: Secondary | ICD-10-CM

## 2021-05-27 ENCOUNTER — Ambulatory Visit
Admission: RE | Admit: 2021-05-27 | Discharge: 2021-05-27 | Disposition: A | Discharge status: Home / Self Care | Payer: No Typology Code available for payment source | Source: Ambulatory Visit | Attending: Internal Medicine | Admitting: Internal Medicine

## 2021-05-27 DIAGNOSIS — M810 Age-related osteoporosis without current pathological fracture: Secondary | ICD-10-CM

## 2021-06-21 ENCOUNTER — Encounter: Payer: Self-pay | Admitting: Oncology

## 2021-06-23 ENCOUNTER — Telehealth: Payer: Self-pay

## 2021-06-23 NOTE — Telephone Encounter (Deleted)
Pt called to inquire about referral for neurologist informed Pt Dr Benay Spice recommends Dr. Carles Collet or Dr. Posey Pronto. Pt stated She will go with Posey Pronto because that's who her PCP recommended as well.

## 2021-06-23 NOTE — Telephone Encounter (Signed)
Pt called to inquire about referral for neurologist informed Pt Dr Benay Spice recommends Dr. Carles Collet or Dr. Posey Pronto. Pt stated She will go with Posey Pronto because that's who her PCP recommended as well.

## 2021-06-25 ENCOUNTER — Other Ambulatory Visit: Payer: Self-pay | Admitting: Internal Medicine

## 2021-06-25 ENCOUNTER — Encounter: Payer: Self-pay | Admitting: Neurology

## 2021-06-25 DIAGNOSIS — Z8739 Personal history of other diseases of the musculoskeletal system and connective tissue: Secondary | ICD-10-CM

## 2021-06-25 DIAGNOSIS — M25512 Pain in left shoulder: Secondary | ICD-10-CM

## 2021-06-25 DIAGNOSIS — R202 Paresthesia of skin: Secondary | ICD-10-CM

## 2021-06-25 DIAGNOSIS — M79632 Pain in left forearm: Secondary | ICD-10-CM

## 2021-06-26 ENCOUNTER — Other Ambulatory Visit: Payer: Self-pay | Admitting: Internal Medicine

## 2021-06-26 DIAGNOSIS — R202 Paresthesia of skin: Secondary | ICD-10-CM

## 2021-06-26 DIAGNOSIS — M79632 Pain in left forearm: Secondary | ICD-10-CM

## 2021-06-26 DIAGNOSIS — Z8739 Personal history of other diseases of the musculoskeletal system and connective tissue: Secondary | ICD-10-CM

## 2021-06-26 DIAGNOSIS — M25512 Pain in left shoulder: Secondary | ICD-10-CM

## 2021-07-12 ENCOUNTER — Other Ambulatory Visit: Payer: Self-pay

## 2021-07-12 ENCOUNTER — Ambulatory Visit
Admission: RE | Admit: 2021-07-12 | Discharge: 2021-07-12 | Disposition: A | Discharge status: Home / Self Care | Payer: No Typology Code available for payment source | Source: Ambulatory Visit | Attending: Internal Medicine | Admitting: Internal Medicine

## 2021-07-12 DIAGNOSIS — R202 Paresthesia of skin: Secondary | ICD-10-CM

## 2021-07-12 DIAGNOSIS — M25512 Pain in left shoulder: Secondary | ICD-10-CM

## 2021-07-12 DIAGNOSIS — M79632 Pain in left forearm: Secondary | ICD-10-CM

## 2021-07-12 DIAGNOSIS — Z8739 Personal history of other diseases of the musculoskeletal system and connective tissue: Secondary | ICD-10-CM

## 2021-08-20 ENCOUNTER — Inpatient Hospital Stay: Payer: No Typology Code available for payment source | Attending: Oncology | Admitting: Oncology

## 2021-08-20 ENCOUNTER — Other Ambulatory Visit: Payer: Self-pay

## 2021-08-20 VITALS — BP 125/83 | HR 61 | Temp 98.2°F | Resp 18 | Ht 66.0 in | Wt 238.4 lb

## 2021-08-20 DIAGNOSIS — Z79899 Other long term (current) drug therapy: Secondary | ICD-10-CM | POA: Diagnosis not present

## 2021-08-20 DIAGNOSIS — M8580 Other specified disorders of bone density and structure, unspecified site: Secondary | ICD-10-CM | POA: Insufficient documentation

## 2021-08-20 DIAGNOSIS — D649 Anemia, unspecified: Secondary | ICD-10-CM | POA: Insufficient documentation

## 2021-08-20 DIAGNOSIS — D447 Neoplasm of uncertain behavior of aortic body and other paraganglia: Secondary | ICD-10-CM | POA: Diagnosis not present

## 2021-08-20 DIAGNOSIS — Z85858 Personal history of malignant neoplasm of other endocrine glands: Secondary | ICD-10-CM | POA: Diagnosis not present

## 2021-08-20 DIAGNOSIS — I10 Essential (primary) hypertension: Secondary | ICD-10-CM | POA: Diagnosis not present

## 2021-08-20 NOTE — Progress Notes (Signed)
?[place] ?OFFICE PROGRESS NOTE ? ? ?Diagnosis: Paraganglioma ? ?INTERVAL HISTORY:  ? ?[name] returns as scheduled.  She feels well.  Good appetite.  No fever, night sweats, tachycardia, or consistent headaches.  She no longer has pain at the right face or neck.  She has intermittent sinus congestion. ?She saw Dr. Buddy Fisher for evaluation of osteopenia.  She reports he obtained repeat tumor markers. ? ?She reports edema at the left ankle for the past year with prolonged sitting ?Objective: ? ?Vital signs in last 24 hours: ? ?Blood pressure 125/83, pulse 61, temperature 98.2 ?F (36.8 ?C), temperature source Oral, resp. rate 18, height 5' 6" (1.676 m), weight 238 lb 6.4 oz (108.1 kg), SpO2 100 %. ?  ? ?HEENT: No sinus tenderness, neck without mass ?Lymphatics: No cervical, supraclavicular, axillary, or inguinal nodes ?Resp: Lungs clear bilaterally ?Cardio: Regular rate and rhythm ?GI: No hepatosplenomegaly, no mass, nontender ?Vascular: Trace pitting edema at the left greater than right lower leg  ? ?Lab Results: ? ?Lab Results  ?Component Value Date  ? WBC 4.2 05/12/2016  ? HGB 12.9 05/12/2016  ? HCT 38.4 05/12/2016  ? MCV 93.4 05/12/2016  ? PLT 150 05/12/2016  ? NEUTROABS 1.6 05/12/2016  ? ? ?CMP  ?Lab Results  ?Component Value Date  ? NA 140 04/16/2016  ? K 4.4 04/16/2016  ? CL 108 01/15/2015  ? CO2 23 04/16/2016  ? GLUCOSE 84 04/16/2016  ? BUN 14.2 04/16/2016  ? CREATININE 1.1 04/16/2016  ? CALCIUM 9.1 04/16/2016  ? PROT 7.3 04/16/2016  ? ALBUMIN 3.3 (L) 04/16/2016  ? AST 52 (H) 04/16/2016  ? ALT 39 04/16/2016  ? ALKPHOS 207 (H) 04/16/2016  ? BILITOT 0.42 04/16/2016  ? GFRNONAA 57 (L) 01/15/2015  ? GFRAA >60 01/15/2015  ? ? ? ?Medications: I have reviewed the patient's current medications. ? ? 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 Restaging CT scans and a bone scan at [facility] in March 2011 revealed no evidence for disease progression. The  serum metanephrine, catecholamine and Chromogranin A levels remained elevated on 10/22/2010.  Levels were within normal range 06/10/2014. ?Intermittent sharp pain at the right face and neck. Potentially related to nerve injury from the surgery/radiation of the skull base versus the skull base mass. The pain has improved with Neurontin. ?History of pancytopenia secondary to MIBG therapy. ?Chronic sinus drainage and headache status post sinus surgery by Dr. Vinetta Fisher at [facility] in July 2009. This was followed by nasal endoscopy and debridement in September 2009. She underwent a repeat debridement on 10/22/2009. ?History of hypertension secondary to the paraganglioma, improved ?Status post Cesarean section delivery October 2007. ?History of oral candidiasis. ?Osteopenia on a bone density scan 10/13/2010 with a low estradiol level.   ?Status post gastric bypass surgery and cholecystectomy 06/15/2011. ?Anemia-question related to the gastric bypass surgery with an element of malnutrition versus myelodysplasia. Diagnostic bone marrow biopsy 02/14/2013 with a slightly hypocellular marrow with a 46XX karyotype, abundant iron stores. Hemoglobin remains stable. ?Left ear and neck pain 02/08/2014. She completed a course of amoxicillin with no improvement. Brain MRI 03/07/2014 showed stable residual enhancing tumor at the skull base compared to 2009. She was referred to ENT and diagnosed with "TMJ ". ?Brain MRI and cervical spine MRI 01/23/2015. Unchanged appearance of the brain without evidence of acute/subacute infarct. No significant interval change in the appearance of the residual skull base tumor. Minimal cervical spondylosis without stenosis. ? ? ? ?Disposition: ?Ms.  Fisher remains in clinical remission from the paraganglioma.  We will follow-up on restaging hormone levels and other recent labs from [place].  She will return for an office visit in 9 months. ? ?Kerry Coder, MD ? ?08/20/2021  ?3:03 PM ? ? ?

## 2021-08-21 ENCOUNTER — Telehealth: Payer: Self-pay

## 2021-08-21 NOTE — Telephone Encounter (Signed)
Per Dr Benay Spice requests for lab form Dr Delene Ruffini office [name]) TC to Day to get labs from the past year tumor markers, CBC, B12, metanephrines, call placed today. Awaiting for fax. ?

## 2021-08-24 ENCOUNTER — Ambulatory Visit: Payer: No Typology Code available for payment source | Admitting: Neurology

## 2021-12-01 ENCOUNTER — Other Ambulatory Visit: Payer: Self-pay | Admitting: Internal Medicine

## 2021-12-01 DIAGNOSIS — Z1231 Encounter for screening mammogram for malignant neoplasm of breast: Secondary | ICD-10-CM

## 2022-01-19 ENCOUNTER — Ambulatory Visit
Admission: RE | Admit: 2022-01-19 | Discharge: 2022-01-19 | Disposition: A | Discharge status: Home / Self Care | Payer: No Typology Code available for payment source | Source: Ambulatory Visit | Attending: Internal Medicine | Admitting: Internal Medicine

## 2022-01-19 DIAGNOSIS — Z1231 Encounter for screening mammogram for malignant neoplasm of breast: Secondary | ICD-10-CM

## 2022-05-17 ENCOUNTER — Inpatient Hospital Stay: Payer: No Typology Code available for payment source | Admitting: Nurse Practitioner

## 2022-08-09 ENCOUNTER — Encounter: Payer: Self-pay | Admitting: Nurse Practitioner

## 2022-08-09 ENCOUNTER — Inpatient Hospital Stay: Payer: No Typology Code available for payment source | Attending: Nurse Practitioner | Admitting: Nurse Practitioner

## 2022-08-09 VITALS — BP 123/83 | HR 62 | Temp 98.2°F | Resp 20 | Ht 66.0 in | Wt 251.0 lb

## 2022-08-09 DIAGNOSIS — D447 Neoplasm of uncertain behavior of aortic body and other paraganglia: Secondary | ICD-10-CM | POA: Diagnosis not present

## 2022-08-09 DIAGNOSIS — M8580 Other specified disorders of bone density and structure, unspecified site: Secondary | ICD-10-CM | POA: Diagnosis not present

## 2022-08-09 DIAGNOSIS — Z8589 Personal history of malignant neoplasm of other organs and systems: Secondary | ICD-10-CM | POA: Diagnosis present

## 2022-08-09 NOTE — Progress Notes (Signed)
  [place] OFFICE PROGRESS NOTE   Diagnosis: Paraganglioma  INTERVAL HISTORY:   [name] returns as scheduled.  Overall she feels well.  She has a good appetite.  No fevers or sweats.  She has occasional "random" pain at the right upper back to the neck.  She continues to note edema at the lower legs, worsens throughout the day and then improves with elevation at nighttime.  Objective:  Vital signs in last 24 hours:  Blood pressure 123/83, pulse 62, temperature 98.2 F (36.8 C), temperature source Oral, resp. rate 20, height '5\' 6"'$  (1.676 m), weight 251 lb (113.9 kg), SpO2 100 %.    HEENT: No thrush or ulcers. Lymphatics: No palpable cervical, supraclavicular, axillary or inguinal lymph nodes. Resp: Lungs clear bilaterally. Cardio: Regular rate and rhythm. GI: Abdomen soft and nontender.  No hepatosplenomegaly.  No mass. Vascular: Trace edema lower leg bilaterally left slightly greater than right.   Lab Results:  Lab Results  Component Value Date   WBC 4.2 05/12/2016   HGB 12.9 05/12/2016   HCT 38.4 05/12/2016   MCV 93.4 05/12/2016   PLT 150 05/12/2016   NEUTROABS 1.6 05/12/2016    Imaging:  No results found.  Medications: I have reviewed the patient's current medications.  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 Restaging CT scans and a bone scan at [facility] in March 2011 revealed no evidence for disease progression. The serum metanephrine, catecholamine and Chromogranin A levels remained elevated on 10/22/2010.  Levels were within normal range 06/10/2014. Intermittent sharp pain at the right face and neck. Potentially related to nerve injury from the surgery/radiation of the skull base versus the skull base mass. The pain has improved with Neurontin. History of pancytopenia secondary to MIBG therapy. Chronic sinus drainage and headache status post sinus surgery by Dr. Vinetta Bergamo at [facility] in July  2009. This was followed by nasal endoscopy and debridement in September 2009. She underwent a repeat debridement on 10/22/2009. History of hypertension secondary to the paraganglioma, improved Status post Cesarean section delivery October 2007. History of oral candidiasis. Osteopenia on a bone density scan 10/13/2010 with a low estradiol level.   Status post gastric bypass surgery and cholecystectomy 06/15/2011. Anemia-question related to the gastric bypass surgery with an element of malnutrition versus myelodysplasia. Diagnostic bone marrow biopsy 02/14/2013 with a slightly hypocellular marrow with a 46XX karyotype, abundant iron stores. Hemoglobin remains stable. Left ear and neck pain 02/08/2014. She completed a course of amoxicillin with no improvement. Brain MRI 03/07/2014 showed stable residual enhancing tumor at the skull base compared to 2009. She was referred to ENT and diagnosed with "TMJ ". Brain MRI and cervical spine MRI 01/23/2015. Unchanged appearance of the brain without evidence of acute/subacute infarct. No significant interval change in the appearance of the residual skull base tumor. Minimal cervical spondylosis without stenosis.      Disposition: Ms. Lassere remains in clinical remission from the paraganglioma.  She will return for follow-up in 9 months.    [name] ANP/GNP-BC   08/09/2022  1:30 PM

## 2022-12-20 ENCOUNTER — Other Ambulatory Visit: Payer: Self-pay | Admitting: Internal Medicine

## 2022-12-20 DIAGNOSIS — Z1231 Encounter for screening mammogram for malignant neoplasm of breast: Secondary | ICD-10-CM

## 2023-01-21 ENCOUNTER — Ambulatory Visit: Payer: No Typology Code available for payment source

## 2023-02-03 ENCOUNTER — Ambulatory Visit
Admission: RE | Admit: 2023-02-03 | Discharge: 2023-02-03 | Disposition: A | Discharge status: Home / Self Care | Payer: No Typology Code available for payment source | Source: Ambulatory Visit | Attending: Internal Medicine | Admitting: Internal Medicine

## 2023-02-03 DIAGNOSIS — Z1231 Encounter for screening mammogram for malignant neoplasm of breast: Secondary | ICD-10-CM

## 2023-05-09 ENCOUNTER — Inpatient Hospital Stay: Payer: No Typology Code available for payment source

## 2023-05-09 ENCOUNTER — Inpatient Hospital Stay: Payer: No Typology Code available for payment source | Attending: Oncology | Admitting: Oncology

## 2023-05-09 ENCOUNTER — Other Ambulatory Visit: Payer: Self-pay

## 2023-05-09 VITALS — BP 100/80 | HR 91 | Temp 98.1°F | Resp 18 | Ht 66.0 in | Wt 228.9 lb

## 2023-05-09 DIAGNOSIS — Z8589 Personal history of malignant neoplasm of other organs and systems: Secondary | ICD-10-CM | POA: Diagnosis present

## 2023-05-09 DIAGNOSIS — M8580 Other specified disorders of bone density and structure, unspecified site: Secondary | ICD-10-CM | POA: Insufficient documentation

## 2023-05-09 DIAGNOSIS — D447 Neoplasm of uncertain behavior of aortic body and other paraganglia: Secondary | ICD-10-CM

## 2023-05-09 NOTE — Progress Notes (Signed)
[facility] OFFICE PROGRESS NOTE   Diagnosis: Paraganglioma  INTERVAL HISTORY:   [name] returns as scheduled.  She feels well.  Good appetite.  She reports intentional weight loss by exercising.  She has dryness of the right eye.  She has been treated by ophthalmology.  She takes Tylenol for occasional headaches. She continues to have intermittent sinus drainage.  She uses flushing/aspiration device to clear the sinus mucus. Objective:  Vital signs in last 24 hours:  Blood pressure 100/80, pulse 91, temperature 98.1 F (36.7 C), temperature source Temporal, resp. rate 18, height 5\' 6"  (1.676 m), weight 228 lb 14.4 oz (103.8 kg), SpO2 99%.    HEENT: Neck without mass, no sinus tenderness Lymphatics: No cervical, supraclavicular, axillary, or inguinal nodes Resp: Lungs clear bilaterally Cardio: Regular rate and rhythm GI: No hepatosplenomegaly Vascular: No leg edema   Lab Results:  Lab Results  Component Value Date   WBC 4.2 05/12/2016   HGB 12.9 05/12/2016   HCT 38.4 05/12/2016   MCV 93.4 05/12/2016   PLT 150 05/12/2016   NEUTROABS 1.6 05/12/2016    CMP  Lab Results  Component Value Date   NA 140 04/16/2016   K 4.4 04/16/2016   CL 108 01/15/2015   CO2 23 04/16/2016   GLUCOSE 84 04/16/2016   BUN 14.2 04/16/2016   CREATININE 1.1 04/16/2016   CALCIUM 9.1 04/16/2016   PROT 7.3 04/16/2016   ALBUMIN 3.3 (L) 04/16/2016   AST 52 (H) 04/16/2016   ALT 39 04/16/2016   ALKPHOS 207 (H) 04/16/2016   BILITOT 0.42 04/16/2016   GFRNONAA 57 (L) 01/15/2015   GFRAA >60 01/15/2015     Medications: I have reviewed the patient's current medications.   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 Restaging CT scans and a bone scan at [facility] in March 2011 revealed no evidence for disease progression. The serum metanephrine, catecholamine and Chromogranin A levels remained elevated on 10/22/2010.  Levels were  within normal range 06/10/2014. Intermittent sharp pain at the right face and neck. Potentially related to nerve injury from the surgery/radiation of the skull base versus the skull base mass. The pain has improved with Neurontin. History of pancytopenia secondary to MIBG therapy. Chronic sinus drainage and headache status post sinus surgery by Dr. Patterson Hammersmith at [facility] in July 2009. This was followed by nasal endoscopy and debridement in September 2009. She underwent a repeat debridement on 10/22/2009. History of hypertension secondary to the paraganglioma, improved Status post Cesarean section delivery October 2007. History of oral candidiasis. Osteopenia on a bone density scan 10/13/2010 with a low estradiol level.   Status post gastric bypass surgery and cholecystectomy 06/15/2011. Anemia-question related to the gastric bypass surgery with an element of malnutrition versus myelodysplasia. Diagnostic bone marrow biopsy 02/14/2013 with a slightly hypocellular marrow with a 46XX karyotype, abundant iron stores. Hemoglobin remains stable. Left ear and neck pain 02/08/2014. She completed a course of amoxicillin with no improvement. Brain MRI 03/07/2014 showed stable residual enhancing tumor at the skull base compared to 2009. She was referred to ENT and diagnosed with "TMJ ". Brain MRI and cervical spine MRI 01/23/2015. Unchanged appearance of the brain without evidence of acute/subacute infarct. No significant interval change in the appearance of the residual skull base tumor. Minimal cervical spondylosis without stenosis.       Disposition: [name] remains in clinical remission from the paraganglioma.  She would like to continue follow-up with the Cancer center.  She will return for an office visit in 8 months.  We will obtain a CBC, CMP, and urine catecholamines/metanephrines when she returns in 8 months.  Thornton Papas, MD  05/09/2023  2:57 PM

## 2023-06-14 ENCOUNTER — Other Ambulatory Visit: Payer: Self-pay | Admitting: Internal Medicine

## 2023-06-14 DIAGNOSIS — M8589 Other specified disorders of bone density and structure, multiple sites: Secondary | ICD-10-CM

## 2023-12-23 ENCOUNTER — Other Ambulatory Visit: Payer: Self-pay | Admitting: Internal Medicine

## 2023-12-23 DIAGNOSIS — Z1231 Encounter for screening mammogram for malignant neoplasm of breast: Secondary | ICD-10-CM

## 2024-01-01 DIAGNOSIS — D447 Neoplasm of uncertain behavior of aortic body and other paraganglia: Secondary | ICD-10-CM | POA: Diagnosis present

## 2024-01-02 ENCOUNTER — Inpatient Hospital Stay: Payer: No Typology Code available for payment source | Attending: Oncology

## 2024-01-02 ENCOUNTER — Ambulatory Visit: Payer: Self-pay | Admitting: Oncology

## 2024-01-02 DIAGNOSIS — D447 Neoplasm of uncertain behavior of aortic body and other paraganglia: Secondary | ICD-10-CM

## 2024-01-02 LAB — CBC WITH DIFFERENTIAL (CANCER CENTER ONLY)
Abs Immature Granulocytes: 0.01 K/uL (ref 0.00–0.07)
Basophils Absolute: 0.1 K/uL (ref 0.0–0.1)
Basophils Relative: 1 %
Eosinophils Absolute: 0.2 K/uL (ref 0.0–0.5)
Eosinophils Relative: 3 %
HCT: 36.5 % (ref 36.0–46.0)
Hemoglobin: 11.7 g/dL — ABNORMAL LOW (ref 12.0–15.0)
Immature Granulocytes: 0 %
Lymphocytes Relative: 57 %
Lymphs Abs: 3.6 K/uL (ref 0.7–4.0)
MCH: 30.6 pg (ref 26.0–34.0)
MCHC: 32.1 g/dL (ref 30.0–36.0)
MCV: 95.5 fL (ref 80.0–100.0)
Monocytes Absolute: 0.5 K/uL (ref 0.1–1.0)
Monocytes Relative: 9 %
Neutro Abs: 1.8 K/uL (ref 1.7–7.7)
Neutrophils Relative %: 30 %
Platelet Count: 206 K/uL (ref 150–400)
RBC: 3.82 MIL/uL — ABNORMAL LOW (ref 3.87–5.11)
RDW: 13.9 % (ref 11.5–15.5)
WBC Count: 6.2 K/uL (ref 4.0–10.5)
nRBC: 0 % (ref 0.0–0.2)

## 2024-01-02 LAB — CMP (CANCER CENTER ONLY)
ALT: 26 U/L (ref 0–44)
AST: 36 U/L (ref 15–41)
Albumin: 4 g/dL (ref 3.5–5.0)
Alkaline Phosphatase: 133 U/L — ABNORMAL HIGH (ref 38–126)
Anion gap: 12 (ref 5–15)
BUN: 25 mg/dL — ABNORMAL HIGH (ref 6–20)
CO2: 19 mmol/L — ABNORMAL LOW (ref 22–32)
Calcium: 9 mg/dL (ref 8.9–10.3)
Chloride: 107 mmol/L (ref 98–111)
Creatinine: 1.53 mg/dL — ABNORMAL HIGH (ref 0.44–1.00)
GFR, Estimated: 40 mL/min — ABNORMAL LOW (ref >60)
Glucose, Bld: 85 mg/dL (ref 70–99)
Potassium: 4.7 mmol/L (ref 3.5–5.1)
Sodium: 139 mmol/L (ref 135–145)
Total Bilirubin: 0.2 mg/dL (ref 0.0–1.2)
Total Protein: 7.6 g/dL (ref 6.5–8.1)

## 2024-01-05 ENCOUNTER — Other Ambulatory Visit (HOSPITAL_BASED_OUTPATIENT_CLINIC_OR_DEPARTMENT_OTHER)

## 2024-01-05 LAB — METANEPHRINES, URINE, 24 HOUR
Metaneph Total, Ur: 34 ug/L
Metanephrines, 24H Ur: 51 ug/(24.h) (ref 36–209)
Normetanephrine, 24H Ur: 777 ug/(24.h) — ABNORMAL HIGH (ref 131–612)
Normetanephrine, Ur: 518 ug/L
Total Volume: 1500

## 2024-01-08 DIAGNOSIS — M8580 Other specified disorders of bone density and structure, unspecified site: Secondary | ICD-10-CM | POA: Insufficient documentation

## 2024-01-08 DIAGNOSIS — D649 Anemia, unspecified: Secondary | ICD-10-CM | POA: Diagnosis not present

## 2024-01-08 DIAGNOSIS — C755 Malignant neoplasm of aortic body and other paraganglia: Secondary | ICD-10-CM | POA: Insufficient documentation

## 2024-01-08 DIAGNOSIS — Z9884 Bariatric surgery status: Secondary | ICD-10-CM | POA: Diagnosis not present

## 2024-01-08 DIAGNOSIS — E041 Nontoxic single thyroid nodule: Secondary | ICD-10-CM | POA: Insufficient documentation

## 2024-01-08 DIAGNOSIS — R7989 Other specified abnormal findings of blood chemistry: Secondary | ICD-10-CM | POA: Diagnosis not present

## 2024-01-09 ENCOUNTER — Inpatient Hospital Stay

## 2024-01-09 ENCOUNTER — Inpatient Hospital Stay: Payer: No Typology Code available for payment source | Attending: Oncology | Admitting: Oncology

## 2024-01-09 VITALS — BP 100/82 | HR 52 | Temp 98.4°F | Resp 16 | Ht 66.0 in | Wt 223.3 lb

## 2024-01-09 DIAGNOSIS — D447 Neoplasm of uncertain behavior of aortic body and other paraganglia: Secondary | ICD-10-CM

## 2024-01-09 DIAGNOSIS — C755 Malignant neoplasm of aortic body and other paraganglia: Secondary | ICD-10-CM | POA: Diagnosis not present

## 2024-01-09 NOTE — Progress Notes (Signed)
 [name]   Diagnosis: Paraganglioma  INTERVAL HISTORY:   [name] returns as scheduled.  She feels well.  Good appetite.  She has intermittent headaches relieved with Tylenol .  No other complaint.  Objective:  Vital signs in last 24 hours:  Blood pressure 100/82, pulse (!) 52, temperature 98.4 F (36.9 C), temperature source Oral, resp. rate 16, height 5' 6 (1.676 m), weight 223 lb 4.8 oz (101.3 kg), SpO2 100%.    HEENT: Neck without mass Lymphatics: No cervical, supraclavicular, axillary, or inguinal nodes Resp: Lungs clear bilaterally Cardio: Regular rate and rhythm GI: No hepatosplenomegaly, no mass, nontender Vascular: No leg edema  Lab Results:  Lab Results  Component Value Date   WBC 6.2 01/02/2024   HGB 11.7 (L) 01/02/2024   HCT 36.5 01/02/2024   MCV 95.5 01/02/2024   PLT 206 01/02/2024   NEUTROABS 1.8 01/02/2024    CMP  Lab Results  Component Value Date   NA 139 01/02/2024   K 4.7 01/02/2024   CL 107 01/02/2024   CO2 19 (L) 01/02/2024   GLUCOSE 85 01/02/2024   BUN 25 (H) 01/02/2024   CREATININE 1.53 (H) 01/02/2024   CALCIUM 9.0 01/02/2024   PROT 7.6 01/02/2024   ALBUMIN 4.0 01/02/2024   AST 36 01/02/2024   ALT 26 01/02/2024   ALKPHOS 133 (H) 01/02/2024   BILITOT 0.2 01/02/2024   GFRNONAA 40 (L) 01/02/2024   GFRAA >60 01/15/2015    No results found.  Medications: I have reviewed the patient's current medications.   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 Restaging CT scans and a bone scan at [facility] in March 2011 revealed no evidence for disease progression. The serum metanephrine, catecholamine and Chromogranin A levels remained elevated on 10/22/2010.  Levels were within normal range 06/10/2014. Intermittent sharp pain at the right face and neck. Potentially related to nerve injury from the surgery/radiation of the skull base versus the skull base  mass. The pain has improved with Neurontin . History of pancytopenia secondary to MIBG therapy. Chronic sinus drainage and headache status post sinus surgery by [name] at [facility] in July 2009. This was followed by nasal endoscopy and debridement in September 2009. She underwent a repeat debridement on 10/22/2009. History of hypertension secondary to the paraganglioma, improved Status post Cesarean section delivery October 2007. History of oral candidiasis. Osteopenia on a bone density scan 10/13/2010 with a low estradiol level.   Status post gastric bypass surgery and cholecystectomy 06/15/2011. Anemia-question related to the gastric bypass surgery with an element of malnutrition versus myelodysplasia. Diagnostic bone marrow biopsy 02/14/2013 with a slightly hypocellular marrow with a 46XX karyotype, abundant iron stores. Hemoglobin remains stable. Left ear and neck pain 02/08/2014. She completed a course of amoxicillin  with no improvement. Brain MRI 03/07/2014 showed stable residual enhancing tumor at the skull base compared to 2009. She was referred to ENT and diagnosed with TMJ . Brain MRI and cervical spine MRI 01/23/2015. Unchanged appearance of the brain without evidence of acute/subacute infarct. No significant interval change in the appearance of the residual skull base tumor. Minimal cervical spondylosis without stenosis.         Disposition: [name] remains in clinical remission from the paraganglioma.  We will follow-up on the results from 24-hour urine metanephrines and catecholamines obtained this week.  She was noted to have a thyroid nodule and a cervical spine MRI in 2023.  She will be referred for a  thyroid ultrasound.  The creatinine was mildly elevated i and the hemoglobin was mildly decreased on a CBC 01/02/2024.  She will return for an office and lab visit in 3 months.  We will check a B12 and ferritin level when she is here in 3 months.  Kerry Hof,  MD  01/09/2024  3:11 PM

## 2024-01-12 DIAGNOSIS — C755 Malignant neoplasm of aortic body and other paraganglia: Secondary | ICD-10-CM | POA: Diagnosis not present

## 2024-01-13 ENCOUNTER — Other Ambulatory Visit: Payer: Self-pay | Admitting: *Deleted

## 2024-01-13 ENCOUNTER — Inpatient Hospital Stay

## 2024-01-13 DIAGNOSIS — D447 Neoplasm of uncertain behavior of aortic body and other paraganglia: Secondary | ICD-10-CM

## 2024-01-16 LAB — METANEPHRINES, URINE, 24 HOUR
Metaneph Total, Ur: 35 ug/L
Metanephrines, 24H Ur: 47 ug/(24.h) (ref 36–209)
Normetanephrine, 24H Ur: 792 ug/(24.h) — ABNORMAL HIGH (ref 131–612)
Normetanephrine, Ur: 587 ug/L
Total Volume: 1350

## 2024-01-19 LAB — CATECHOLAMINES,UR.,FREE,24 HR
Dopamine, Rand Ur: 141 ug/L
Dopamine, Ur, 24Hr: 268 ug/(24.h) (ref 0–510)
Epinephrine, Rand Ur: <3 ug/L
Epinephrine, U, 24Hr: <6 ug/(24.h) (ref 0–20)
Norepinephrine, Rand Ur: 48 ug/L
Norepinephrine,U,24H: 91 ug/(24.h) (ref 0–135)
Total Volume: 1900

## 2024-01-20 ENCOUNTER — Ambulatory Visit: Payer: Self-pay | Admitting: Oncology

## 2024-01-20 ENCOUNTER — Ambulatory Visit (HOSPITAL_BASED_OUTPATIENT_CLINIC_OR_DEPARTMENT_OTHER)
Admission: RE | Admit: 2024-01-20 | Discharge: 2024-01-20 | Disposition: A | Discharge status: Home / Self Care | Source: Ambulatory Visit | Attending: Oncology | Admitting: Oncology

## 2024-01-20 DIAGNOSIS — D447 Neoplasm of uncertain behavior of aortic body and other paraganglia: Secondary | ICD-10-CM | POA: Diagnosis present

## 2024-01-20 NOTE — Telephone Encounter (Signed)
-----   Message from Arley Hof sent at 01/20/2024  7:06 AM EDT ----- Please call patient, one of the metanephrines on the repeat urine was mildly elevated, other levels are normal, she appears to be in clinical remission from the paraganglioma, follow-up as scheduled  ----- Message ----- From: Rebecka, Lab In [place] Sent: 01/19/2024   6:36 AM EDT To: Arley KATHEE Hof, MD

## 2024-01-20 NOTE — Telephone Encounter (Signed)
 Notified [name] that one of the metanephrines on the repeat urine was mildly elevated, other levels are normal, she appears to be in clinical remission from the paraganglioma, follow-up as scheduled. She appreciated the call

## 2024-01-27 ENCOUNTER — Other Ambulatory Visit: Payer: Self-pay

## 2024-01-27 ENCOUNTER — Ambulatory Visit: Payer: Self-pay | Admitting: Oncology

## 2024-01-27 DIAGNOSIS — D447 Neoplasm of uncertain behavior of aortic body and other paraganglia: Secondary | ICD-10-CM

## 2024-01-27 NOTE — Telephone Encounter (Signed)
-----   Message from Arley Hof sent at 01/27/2024  3:15 PM EDT ----- Please call patient, the right thyroid  nodule is likely benign, but meets criteria for a biopsy, a fine-needle biopsy is recommended, please schedule biopsy of the nodule in interventional radiology  if she agrees  ----- Message ----- From: Interface, Rad Results In Sent: 01/27/2024   2:59 PM EDT To: Arley KATHEE Hof, MD

## 2024-01-27 NOTE — Telephone Encounter (Signed)
 Called [name] with thyroid  US  results. Most likely benign but meets criteria for biopsy. She agrees.

## 2024-02-03 ENCOUNTER — Other Ambulatory Visit: Payer: No Typology Code available for payment source

## 2024-02-07 ENCOUNTER — Ambulatory Visit

## 2024-02-17 ENCOUNTER — Other Ambulatory Visit (HOSPITAL_COMMUNITY)
Admission: RE | Admit: 2024-02-17 | Discharge: 2024-02-17 | Disposition: A | Discharge status: Home / Self Care | Source: Ambulatory Visit | Attending: Radiology | Admitting: Radiology

## 2024-02-17 ENCOUNTER — Inpatient Hospital Stay
Admission: RE | Admit: 2024-02-17 | Discharge: 2024-02-17 | Disposition: A | Discharge status: Home / Self Care | Source: Ambulatory Visit | Attending: Oncology | Admitting: Oncology

## 2024-02-17 DIAGNOSIS — E041 Nontoxic single thyroid nodule: Secondary | ICD-10-CM | POA: Diagnosis present

## 2024-02-17 DIAGNOSIS — D447 Neoplasm of uncertain behavior of aortic body and other paraganglia: Secondary | ICD-10-CM

## 2024-02-20 ENCOUNTER — Ambulatory Visit
Admission: RE | Admit: 2024-02-20 | Discharge: 2024-02-20 | Disposition: A | Discharge status: Home / Self Care | Source: Ambulatory Visit | Attending: Internal Medicine | Admitting: Internal Medicine

## 2024-02-20 DIAGNOSIS — Z1231 Encounter for screening mammogram for malignant neoplasm of breast: Secondary | ICD-10-CM

## 2024-02-21 ENCOUNTER — Ambulatory Visit: Payer: Self-pay | Admitting: Oncology

## 2024-02-21 LAB — CYTOLOGY - NON PAP

## 2024-04-10 ENCOUNTER — Inpatient Hospital Stay: Attending: Oncology

## 2024-04-10 DIAGNOSIS — D447 Neoplasm of uncertain behavior of aortic body and other paraganglia: Secondary | ICD-10-CM

## 2024-04-10 LAB — CMP (CANCER CENTER ONLY)
ALT: 25 U/L (ref 0–44)
AST: 29 U/L (ref 15–41)
Albumin: 4.1 g/dL (ref 3.5–5.0)
Alkaline Phosphatase: 145 U/L — ABNORMAL HIGH (ref 38–126)
Anion gap: 9 (ref 5–15)
BUN: 23 mg/dL — ABNORMAL HIGH (ref 6–20)
CO2: 21 mmol/L — ABNORMAL LOW (ref 22–32)
Calcium: 9.4 mg/dL (ref 8.9–10.3)
Chloride: 109 mmol/L (ref 98–111)
Creatinine: 1.31 mg/dL — ABNORMAL HIGH (ref 0.44–1.00)
GFR, Estimated: 49 mL/min — ABNORMAL LOW (ref >60)
Glucose, Bld: 70 mg/dL (ref 70–99)
Potassium: 4.2 mmol/L (ref 3.5–5.1)
Sodium: 139 mmol/L (ref 135–145)
Total Bilirubin: 0.2 mg/dL (ref 0.0–1.2)
Total Protein: 7.4 g/dL (ref 6.5–8.1)

## 2024-04-10 LAB — VITAMIN B12: Vitamin B-12: 303 pg/mL (ref 180–914)

## 2024-04-10 LAB — FERRITIN: Ferritin: 170 ng/mL (ref 11–307)

## 2024-04-11 NOTE — Telephone Encounter (Signed)
 Patient gave verbal understanding and had no further questions.

## 2024-04-11 NOTE — Telephone Encounter (Signed)
-----   Message from Arley Hof sent at 04/10/2024  7:24 PM EST ----- Please call patient, the creatinine is better, b12 and ferritin are normal, f/u as scheduled  ----- Message ----- From: Rebecka, Lab In [place] Sent: 04/10/2024   2:48 PM EST To: Arley KATHEE Hof, MD

## 2024-04-19 ENCOUNTER — Encounter: Payer: Self-pay | Admitting: Oncology

## 2024-04-19 ENCOUNTER — Inpatient Hospital Stay (HOSPITAL_BASED_OUTPATIENT_CLINIC_OR_DEPARTMENT_OTHER): Admitting: Nurse Practitioner

## 2024-04-19 VITALS — BP 113/85 | HR 60 | Temp 97.8°F | Resp 18 | Ht 66.0 in | Wt 230.5 lb

## 2024-04-19 DIAGNOSIS — D447 Neoplasm of uncertain behavior of aortic body and other paraganglia: Secondary | ICD-10-CM

## 2024-04-19 NOTE — Progress Notes (Signed)
 [facility] OFFICE PROGRESS NOTE   Diagnosis: Paraganglioma  INTERVAL HISTORY:   [name] returns as scheduled.  She feels well.  She continues to have periodic headaches.  Rare right neck pain.  No nausea or vomiting.  No constipation or diarrhea.  No fevers or sweats.  Objective:  Vital signs in last 24 hours:  Blood pressure 113/85, pulse 60, temperature 97.8 F (36.6 C), temperature source Temporal, resp. rate 18, height 5' 6 (1.676 m), weight 230 lb 8 oz (104.6 kg), SpO2 100%.    HEENT: No thrush or ulcers. Lymphatics: No palpable cervical, supraclavicular, axillary or inguinal lymph nodes. Resp: Lungs clear bilaterally. Cardio: Regular rate and rhythm. GI: No hepatosplenomegaly.  Nontender. Vascular: No leg edema.   Lab Results:  Lab Results  Component Value Date   WBC 6.2 01/02/2024   HGB 11.7 (L) 01/02/2024   HCT 36.5 01/02/2024   MCV 95.5 01/02/2024   PLT 206 01/02/2024   NEUTROABS 1.8 01/02/2024    Imaging:  No results found.  Medications: I have reviewed the patient's current medications.  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 Restaging CT scans and a bone scan at [facility] in March 2011 revealed no evidence for disease progression. The serum metanephrine, catecholamine and Chromogranin A levels remained elevated on 10/22/2010. Levels were within normal range 06/10/2014. Intermittent sharp pain at the right face and neck. Potentially related to nerve injury from the surgery/radiation of the skull base versus the skull base mass. The pain has improved with Neurontin . History of pancytopenia secondary to MIBG therapy. Chronic sinus drainage and headache status post sinus surgery by Dr. Clayton at [facility] in July 2009. This was followed by nasal endoscopy and debridement in September 2009. She underwent a repeat debridement on 10/22/2009. History of hypertension secondary to the  paraganglioma, improved Status post Cesarean section delivery October 2007. History of oral candidiasis. Osteopenia on a bone density scan 10/13/2010 with a low estradiol level.   Status post gastric bypass surgery and cholecystectomy 06/15/2011. Anemia-question related to the gastric bypass surgery with an element of malnutrition versus myelodysplasia. Diagnostic bone marrow biopsy 02/14/2013 with a slightly hypocellular marrow with a 46XX karyotype, abundant iron stores. Hemoglobin remains stable. Left ear and neck pain 02/08/2014. She completed a course of amoxicillin  with no improvement. Brain MRI 03/07/2014 showed stable residual enhancing tumor at the skull base compared to 2009. She was referred to ENT and diagnosed with TMJ . Brain MRI and cervical spine MRI 01/23/2015. Unchanged appearance of the brain without evidence of acute/subacute infarct. No significant interval change in the appearance of the residual skull base tumor. Minimal cervical spondylosis without stenosis. Thyroid  ultrasound 01/20/2024-nodule 1 measuring 2.1 cm located in the inferior right thyroid  lobe, meets criteria for FNA.  Ultrasound biopsy 02/17/2024-benign follicular nodule.  Disposition: [name] remains in clinical remission from the paraganglioma.  We reviewed the results from the 24-hour urine metanephrines and catecholamines completed 01/08/2024.  One of the metanephrines was mildly elevated, other levels normal.  Plan for repeat 24-hour urine metanephrines and catecholamines plus plasma levels and chromogranin A in approximately 3 months.    Due to history of gastric bypass surgery we will also check routine labs plus B12 and ferritin levels.    She will return for a follow-up visit about 2 weeks after the lab appointment to review results.  We are available to see her sooner if needed.  Plan reviewed with Dr. Cloretta.  Olam Ned ANP/GNP-BC   04/19/2024  2:53 PM

## 2024-07-19 ENCOUNTER — Other Ambulatory Visit (HOSPITAL_BASED_OUTPATIENT_CLINIC_OR_DEPARTMENT_OTHER)

## 2024-08-06 ENCOUNTER — Inpatient Hospital Stay

## 2024-08-21 ENCOUNTER — Inpatient Hospital Stay: Admitting: Oncology
